# Patient Record
Sex: Female | Born: 1950 | Race: White | Hispanic: No | Marital: Married | Smoking: Never smoker
Health system: Southern US, Community
[De-identification: ages and names within clinical notes are randomized; demographics above are authoritative.]

## PROBLEM LIST (undated history)

## (undated) DIAGNOSIS — R7303 Prediabetes: Secondary | ICD-10-CM

## (undated) DIAGNOSIS — M8580 Other specified disorders of bone density and structure, unspecified site: Secondary | ICD-10-CM

## (undated) DIAGNOSIS — I10 Essential (primary) hypertension: Secondary | ICD-10-CM

## (undated) HISTORY — PX: PARTIAL HYSTERECTOMY: SHX80

## (undated) HISTORY — DX: Other specified disorders of bone density and structure, unspecified site: M85.80

## (undated) HISTORY — DX: Essential (primary) hypertension: I10

## (undated) HISTORY — DX: Prediabetes: R73.03

## (undated) HISTORY — PX: OTHER PROCEDURE: U1053

## (undated) HISTORY — PX: EGD/FLEX SIG: SHX3003

## (undated) MED ORDER — ESTRADIOL 0.1 MG/GM VA CREA
TOPICAL_CREAM | VAGINAL | Status: AC
Start: 2019-12-26 — End: ?

---

## 2015-12-29 ENCOUNTER — Encounter (INDEPENDENT_AMBULATORY_CARE_PROVIDER_SITE_OTHER): Payer: Self-pay | Admitting: Internal Medicine

## 2015-12-29 ENCOUNTER — Ambulatory Visit (INDEPENDENT_AMBULATORY_CARE_PROVIDER_SITE_OTHER): Payer: Medicare Other | Admitting: Internal Medicine

## 2015-12-29 VITALS — BP 131/84 | HR 64 | Resp 16 | Ht 68.0 in | Wt 175.0 lb

## 2015-12-29 DIAGNOSIS — E782 Mixed hyperlipidemia: Secondary | ICD-10-CM

## 2015-12-29 DIAGNOSIS — Z8249 Family history of ischemic heart disease and other diseases of the circulatory system: Secondary | ICD-10-CM

## 2015-12-29 NOTE — Progress Notes (Signed)
Cardiology Attending Note:    Subjective:  I reviewed the history.  Patient interviewed and examined.  History of present illness (HPI):  Heart Problem     Review of Systems (ROS): As per  the resident's note.  Past Medical, Family, Social History:  As per  the resident's  note.    Objective:   I have examined the patient and I concur with the resident's exam.    Assessment and plan reviewed with the resident physician.  I agree with the resident's plan as documented.    See the resident's note for further details.    Following instructions given to patient:  1)get echocardiogram    2) get fasting blood work done    3) follow up in 2 months      Savir Blanke R. Carlena Saxaub, MD, [facility]  Associate Professor of Medicine  [facility]  Division of Cardiovascular Medicine  [phone] [facility]([place] office)  [phone] [facility] office)  [phone] (fax)  [email or web address]

## 2015-12-29 NOTE — Patient Instructions (Signed)
1) get echocardiogram    2) get fasting blood work done    3) follow up in 2 months

## 2015-12-29 NOTE — Progress Notes (Signed)
CARDIOLOGY CLINIC NOTE    Attending MD: Dr. Carlena Saxaub    Chief Complaint:  Family h/o aortic stenosis (2 brothers)    History of Present Illness:     Morgan Shaw is a 65 year old female with family history of aortic stenosis, presenting for assessment.    Patient is here because she has 2 brothers diagnosed with aortic stenosis, one has already had valve replacement surgery, the other brother is scheduled for valve replacement next month. [place] in [place] and patient does not know any further details of their diagnoses.    Patient does snore, has sleep study scheduled for tomorrow night.    Patient denies CP, dizziness, lightheadedness.    FH:  Sleep apnea: 1 brother  Aortic stenosis: 2 brothers  Valve replacement: 1 brother completed valve replacement, other scheduled for next month.  Father: HF, deceased at age 65    SOCIAL HX:  Never smoker  EtOH 2-3 drinks per week    FUNCTIONAL STATUS:  - [place] this past weekend, no difficulty  - Yoga 2-3x per week  - Walks dogs 1 mile daily   - Walks 4.2 miles twice a week (beach walk with friends, is able to have conversation the whole time).    Past Medical and Surgical History:  History reviewed. No pertinent past medical history.    No past surgical history on file.    Allergies:  Allergies   Allergen Reactions   . Sulfa Drugs Rash       Medications:  No outpatient prescriptions have been marked as taking for the 12/29/15 encounter (Office Visit) with Shary Keyaub, Pam R., MD.       Social History:  Social History     Social History   . Marital status: Married     Spouse name: N/A   . Number of children: N/A   . Years of education: N/A     Occupational History   . Not on file.     Social History Main Topics   . Smoking status: Never Smoker   . Smokeless tobacco: Not on file   . Alcohol use Not on file   . Drug use: Not on file   . Sexual activity: Not on file     Other Topics Concern   . Not on file     Social History Narrative   . No  narrative on file       Family History:  Family History   Problem Relation Age of Onset   . Heart Attack Father 1969   . Heart Attack Paternal Grandfather 6163     Aortic heart valve replacement   . Other Brother 6163     Aortic heart valve replacement   . Other Brother 5967     Aortic heart valve replacement, sleep apnea     DIAGNOSTICS  EKG 12/29/15: NSR    Review of Systems:  Negative except as noted above    Physical Exam:  BP 131/84  Pulse 64  Resp 16  Ht 5\' 8"  (1.727 m)  Wt 79.4 kg (175 lb)  SpO2 97%  BMI 26.61 kg/m2 /      Gen: NAD  HEENT: NCAT  Neck: No JVP  Lungs: CTA B  CVS: Reg S1 and S2, no MRG  Ext: No CCE  Neuro: non-focal    Labs and Other Data:  12/19/15  Vitamin D 35.2  Cre 0.98    No  lipids on record    Assessment and Care Plan:  65 year old female with family history of aortic stenosis in 2 brothers, presenting for assessment. Patient asymptomatic, EKG NSR.    #Family h/o aortic stenosis:  - Check lipid panel, lipoprotein A  - Echo    Patient Instructions:  1)get echocardiogram    2) get fasting blood work done    3) follow up in 2 months    Patient seen and discussed with attending, Carlena Sax.  ____________________  Thomes Cake, MD   [facility], PGY-2  Pager [phone]

## 2015-12-30 LAB — ECG 12-LEAD
ATRIAL RATE: 61 {beats}/min
ECG INTERPRETATION: NORMAL
P AXIS: 58 degrees
PR INTERVAL: 164 ms
QRS INTERVAL/DURATION: 88 ms
QT: 392 ms
QTC INTERVAL: 394 ms
R AXIS: 11 degrees
T AXIS: 42 degrees
VENTRICULAR RATE: 61 {beats}/min

## 2016-01-02 LAB — LIPID(CHOL FRACT) PANEL, BLOOD
Cholesterol: 200 — ABNORMAL HIGH
HDL-Cholesterol: 71
LDL-Chol (Calc): 99
LDL-P: 1049 — ABNORMAL HIGH
LP-IR Score: <25
Triglycerides: 149

## 2016-01-05 ENCOUNTER — Encounter (INDEPENDENT_AMBULATORY_CARE_PROVIDER_SITE_OTHER): Payer: Self-pay | Admitting: Internal Medicine

## 2016-01-12 ENCOUNTER — Ambulatory Visit (INDEPENDENT_AMBULATORY_CARE_PROVIDER_SITE_OTHER): Payer: Medicare Other

## 2016-01-12 DIAGNOSIS — I519 Heart disease, unspecified: Secondary | ICD-10-CM

## 2016-01-12 DIAGNOSIS — Z8249 Family history of ischemic heart disease and other diseases of the circulatory system: Secondary | ICD-10-CM

## 2016-01-12 HISTORY — PX: OTHER PROCEDURE: U1053

## 2016-01-12 LAB — 2D ECHO WITH IMAGE ENHANCEMENT AGENT IF NECESSARY
IVC Diameter: 1.28 cm
LA Volume Index: 18.3 ml/m²
LV Ejection Fraction: 67 %
PA Pressure: 26.4 mmHg

## 2016-03-03 ENCOUNTER — Ambulatory Visit (INDEPENDENT_AMBULATORY_CARE_PROVIDER_SITE_OTHER): Payer: Medicare Other | Admitting: Internal Medicine

## 2016-03-03 ENCOUNTER — Encounter (INDEPENDENT_AMBULATORY_CARE_PROVIDER_SITE_OTHER): Payer: Self-pay | Admitting: Internal Medicine

## 2016-03-03 VITALS — BP 134/90 | HR 68 | Temp 98.2°F | Resp 16 | Ht 68.0 in | Wt 174.6 lb

## 2016-03-03 DIAGNOSIS — E669 Obesity, unspecified: Secondary | ICD-10-CM

## 2016-03-03 MED ORDER — PROBIOTIC ACIDOPHILUS PO
1.00 | Freq: Every day | ORAL | Status: DC
Start: ? — End: 2019-08-07

## 2016-03-03 MED ORDER — VITAMIN D (CHOLECALCIFEROL) 400 UNITS PO TABS: 400.00 [IU] | ORAL_TABLET | Freq: Every day | ORAL | Status: AC

## 2016-03-03 MED ORDER — OMEGA-3-ACID ETHYL ESTERS 1 GM OR CAPS
2.00 g | ORAL_CAPSULE | Freq: Two times a day (BID) | ORAL | Status: DC
Start: ? — End: 2021-05-04

## 2016-03-03 MED ORDER — MULTI-VITAMIN OR TABS: 1.00 | ORAL_TABLET | Freq: Every day | ORAL | Status: AC

## 2016-03-03 MED ORDER — CALCIUM 500 MG OR TABS: 500.00 mg | ORAL_TABLET | Freq: Three times a day (TID) | ORAL | Status: AC

## 2016-03-03 NOTE — Patient Instructions (Signed)
Follow up as needed

## 2016-03-03 NOTE — Progress Notes (Signed)
CARDIOLOGY CLINIC NOTE    Attending MD: Dr. Carlena Saxaub    Chief Complaint:  Family h/o aortic stenosis (2 brothers)    History of Present Illness:     Morgan Shaw is a 65 year old female with family history of aortic stenosis, presenting for assessment.      Interval History:  Her first visit with me was 12/29/15      TTE 5//17:  Summary:  1. The left ventricular size is normal and systolic function is normal.  2. Normal pattern of left ventricular diastolic filling.  3. No previous study for comparison.    Patient is here because she has 2 brothers diagnosed with aortic stenosis, one has already had valve replacement surgery, the other brother is scheduled for valve replacement next month. Brothers live in [place] and patient does not know any further details of their diagnoses.    Patient does snore, has sleep study scheduled for tomorrow night.    Patient denies CP, dizziness, lightheadedness.    FH:  Sleep apnea: 1 brother  Aortic stenosis: 2 brothers  Valve replacement: 1 brother completed valve replacement, other scheduled for next month.  Father: HF, deceased at age 65    SOCIAL HX:  Never smoker  EtOH 2-3 drinks per week    FUNCTIONAL STATUS:  - Went for a [place] hike in [place] with [place] this past weekend, no difficulty  - Yoga 2-3x per week  - Walks dogs 1 mile daily   - Walks 4.2 miles twice a week (beach walk with friends, is able to have conversation the whole time).    Past Medical and Surgical History:  No past medical history on file.    No past surgical history on file.    Allergies:  Allergies   Allergen Reactions   . Sulfa Drugs Rash       Medications:  No outpatient prescriptions have been marked as taking for the 03/03/16 encounter (Appointment) with Shary Keyaub, Zahari Fazzino R., MD.       Social History:  Social History     Social History   . Marital status: Married     Spouse name: N/A   . Number of children: N/A   . Years of education: N/A     Occupational History   . Not on file.     Social History  Main Topics   . Smoking status: Never Smoker   . Smokeless tobacco: Not on file   . Alcohol use Not on file   . Drug use: Not on file   . Sexual activity: Not on file     Other Topics Concern   . Not on file     Social History Narrative   . No narrative on file       Family History:  Family History   Problem Relation Age of Onset   . Heart Attack Father 1269   . Heart Attack Paternal Grandfather 5863     Aortic heart valve replacement   . Other Brother 8663     Aortic heart valve replacement   . Other Brother 7667     Aortic heart valve replacement, sleep apnea     DIAGNOSTICS  EKG 12/29/15: NSR    Review of Systems:  Negative except as noted above    Physical Exam:  There were no vitals taken for this visit. /      Gen: NAD  HEENT: NCAT  Neck: No JVP  Lungs: CTA B  CVS: Reg  S1 and S2, no MRG  Ext: No CCE  Neuro: non-focal    Labs and Other Data:  12/19/15  Vitamin D 35.2  Cre 0.98      12/30/15  LDL P 1049  LDL C 99  HDL 71  TG 149  TC 200  Lp (a) 12    Assessment and Care Plan:  65 year old female with family history of aortic stenosis in 2 brothers.    #Family h/o aortic stenosis:  Her TTE reveals no evidence of aortic valve pathology.  Her Lp(a) is normal and LDL P in good range.  She has good functional status and no symptoms to suggest ischemia.    Patient Instructions:  Follow up PRN    Klee Kolek R. Carlena Sax, MD, [facility]  Associate Professor of Medicine  [facility]  Division of Cardiovascular Medicine  [phone] [facility] office)  [phone] [facility] office)  [phone] (fax)  [email or web address]

## 2017-08-16 ENCOUNTER — Encounter (INDEPENDENT_AMBULATORY_CARE_PROVIDER_SITE_OTHER): Payer: Self-pay | Admitting: Internal Medicine

## 2017-08-16 ENCOUNTER — Ambulatory Visit (INDEPENDENT_AMBULATORY_CARE_PROVIDER_SITE_OTHER): Admitting: Internal Medicine

## 2017-08-16 VITALS — BP 151/106 | HR 76 | Ht 68.0 in | Wt 174.0 lb

## 2017-08-16 DIAGNOSIS — G4752 REM sleep behavior disorder: Principal | ICD-10-CM

## 2017-12-06 ENCOUNTER — Ambulatory Visit (INDEPENDENT_AMBULATORY_CARE_PROVIDER_SITE_OTHER): Admitting: Internal Medicine

## 2017-12-06 ENCOUNTER — Encounter (INDEPENDENT_AMBULATORY_CARE_PROVIDER_SITE_OTHER): Payer: Self-pay | Admitting: Internal Medicine

## 2017-12-06 VITALS — BP 143/91 | HR 76 | Ht 68.0 in | Wt 174.0 lb

## 2017-12-06 NOTE — Interdisciplinary (Signed)
I Morgan Shaw have reviewed medications and allergies with the patient.

## 2017-12-07 ENCOUNTER — Telehealth (INDEPENDENT_AMBULATORY_CARE_PROVIDER_SITE_OTHER): Payer: Self-pay | Admitting: Internal Medicine

## 2017-12-07 DIAGNOSIS — G479 Sleep disorder, unspecified: Secondary | ICD-10-CM

## 2017-12-07 DIAGNOSIS — G4752 REM sleep behavior disorder: Secondary | ICD-10-CM

## 2017-12-07 DIAGNOSIS — R0683 Snoring: Secondary | ICD-10-CM

## 2017-12-07 NOTE — Telephone Encounter (Signed)
The patient shortly after her appt yesterday and again this morning seeking results of her PSG. We received the results late afternoon yesterday.    - I have reviewed her PSG from 09/23/08 at [facility]: it is a good PSG, normal SOL and REM latency, normal sleep staging except minimal reduction in REM and increase in Stage 1 sleep. Sleep efficiency was normal to high at 89.3 %. AHI of 0.0 overall, in REM and supine desat to 90%. No movement disorder or arrhythmia.    I just called the pt with results of the sleep study. All qeuestions answered    Options include:  1. HST for definitive eval - if HST and PSG both normal than cn very confidently say she does not have OSA and jus thas primary RBD.  2. Med mgmt with trial of clonazepam first, then if fails, try cholinergic agent such as rivastigmine or donezepil    PLAN:  1) pt would like HST for more definitive eval for OSA, will order ASAP  2) if no OSA on HST, will proceed with med mgmt as above and rever to movement specialist to eval for early PD.

## 2019-02-13 ENCOUNTER — Encounter (INDEPENDENT_AMBULATORY_CARE_PROVIDER_SITE_OTHER): Payer: Self-pay

## 2019-02-16 ENCOUNTER — Other Ambulatory Visit (INDEPENDENT_AMBULATORY_CARE_PROVIDER_SITE_OTHER): Payer: Self-pay | Admitting: Family Medicine

## 2019-02-19 LAB — ALLERGEN PROFILE WITH TOTAL IGE, RESPIRATORY, AREA 13  -LABCORP
D001-IgE D pteronyssinus: <0.1 kU/L
D002-IgE D farinae: <0.1 kU/L
E001-IgE Cat Dander: <0.1 kU/L
E005-IgE Dog Dander: <0.1 kU/L
E072-IgE Mouse Urine: <0.1 kU/L
G002-IgE Bermuda Grass: <0.1 kU/L
G006-IgE Timothy Grass (Phleum pratense): <0.1 kU/L
G010-IgE Johnson Grass: <0.1 kU/L
I006-IgE Cockroach, German (Blatella germanica): <0.1 kU/L
Immunoglobulin E, Total: 11 IU/mL (ref 6–495)
M001-IgE Penicillium chrysogen: <0.1 kU/L
M002-IgE Cladosporium herbarum: <0.1 kU/L
M003-IgE Aspergillus fumigatus: <0.1 kU/L
M006-IgE Alternaria alternata: <0.1 kU/L
T002-IgE Alder, Grey (Alnus incana): <0.1 kU/L
T006-IgE Cedar, Mountain: <0.1 kU/L
T007-IgE Oak, White: <0.1 kU/L
T008-IgE Elm, American: <0.1 kU/L
T009-IgE Olive Tree: <0.1 kU/L
T010-IgE Walnut: <0.1 kU/L
T014-IgE Cottonwood (Populus deltoides): <0.1 kU/L
T070-IgE White Mulberry: <0.1 kU/L
W001-IgE Ragweed, Short: <0.1 kU/L
W006-IgE Mugwort (Artemisia vulgaris): <0.1 kU/L
W011-IgE Thistle, Russian: <0.1 kU/L
W014-IgE Pigweed, Rough: <0.1 kU/L

## 2019-02-19 LAB — CMP14 AND EGFR - LABCORP
A/G Ratio: 2.4 — ABNORMAL HIGH (ref 1.2–2.2)
ALT (SGPT): 20 IU/L (ref 0–32)
AST: 25 IU/L (ref 0–40)
Albumin: 4.5 g/dL (ref 3.8–4.8)
Alkaline Phos: 71 IU/L (ref 39–117)
BUN/Creatinine Ratio: 18 (ref 12–28)
BUN: 16 mg/dL (ref 8–27)
Bilirubin, Total: 0.4 mg/dL (ref 0.0–1.2)
Calcium: 9.6 mg/dL (ref 8.7–10.3)
Carbon Dioxide: 24 mmol/L (ref 20–29)
Chloride: 100 mmol/L (ref 96–106)
Creatinine: 0.9 mg/dL (ref 0.57–1.00)
Globulin, Total: 1.9 g/dL (ref 1.5–4.5)
Glucose: 75 mg/dL (ref 65–99)
Potassium: 3.9 mmol/L (ref 3.5–5.2)
Protein, Total, Serum: 6.4 g/dL (ref 6.0–8.5)
Sodium: 138 mmol/L (ref 134–144)
eGFR If Africn Am: 76 mL/min/{1.73_m2} (ref >59)
eGFR If NonAfricn Am: 66 mL/min/{1.73_m2} (ref >59)

## 2019-02-19 LAB — CBC WITH DIFF, BLOOD
Baso (Absolute): 0 10*3/uL (ref 0.0–0.2)
Basos: 1 %
Eos (Absolute): 0.1 10*3/uL (ref 0.0–0.4)
Eos: 1 %
Hematocrit: 37.6 % (ref 34.0–46.6)
Hemoglobin: 12.2 g/dL (ref 11.1–15.9)
Immature Grans (Abs): 0 10*3/uL (ref 0.0–0.1)
Immature Granulocytes: 0 %
Lymphs (Absolute): 2.2 10*3/uL (ref 0.7–3.1)
Lymphs: 34 %
MCH: 28.8 pg (ref 26.6–33.0)
MCHC: 32.4 g/dL (ref 31.5–35.7)
MCV: 89 fL (ref 79–97)
Monocytes(Absolute): 0.5 10*3/uL (ref 0.1–0.9)
Monocytes: 7 %
Neutrophils (Absolute): 3.6 10*3/uL (ref 1.4–7.0)
Neutrophils: 57 %
Platelets: 302 10*3/uL (ref 150–450)
RBC: 4.23 x10E6/uL (ref 3.77–5.28)
RDW: 14.6 % (ref 11.7–15.4)
WBC: 6.4 10*3/uL (ref 3.4–10.8)

## 2019-02-19 LAB — TSH REFLEX TO T4 - LABCORP: TSH: 0.595 u[IU]/mL (ref 0.450–4.500)

## 2019-02-19 LAB — GLYCOSYLATED HGB(A1C), BLOOD: Hemoglobin A1c: 5.7 % — ABNORMAL HIGH (ref 4.8–5.6)

## 2019-02-22 ENCOUNTER — Telehealth (INDEPENDENT_AMBULATORY_CARE_PROVIDER_SITE_OTHER): Payer: Self-pay | Admitting: Family Medicine

## 2019-03-20 ENCOUNTER — Telehealth (INDEPENDENT_AMBULATORY_CARE_PROVIDER_SITE_OTHER): Payer: Self-pay | Admitting: Family Medicine

## 2019-04-19 MED ORDER — HYDROCHLOROTHIAZIDE 25 MG OR TABS
ORAL_TABLET | ORAL | Status: DC
Start: 2019-02-03 — End: 2019-07-31

## 2019-04-19 NOTE — Progress Notes (Signed)
GENERAL PHYSICAL EXAM FEMALE    SUBJECTIVE  Morgan Shaw is a 68 year old female who comes to the office for a routine well-woman evaluation.  LMP: Menopause  PAP: normal & (-) HPV 03/2015  Mammo:03/16/19, normal  CRC screen: 12/2015 Colonoscopy- tubular adenoma x1, repeat in 2022  DEXA (if >= 12 y/o or has risk factors): 04/06/19, see results - Osteopenia  Eye Exam: Yearly, due soon  Dental:  Every 6 months, went to dentist today   Labs: 02/16/19, all normal other than some mild prediabetes, A1C 5.7  Diet: No restrictions  Exercise: Walk 3x/week, jazzercise   Immunizations: up-to-date, Shingrix due but wishes to hold until pandemic calms down a bit.  Concerns:  -Patient c/o swollen glands x 1 month, negative covid test x 1 month, worsens when temperature changes at night. Not severe today, they tend to change in size.  -Pt tried to wean her self off of her HCTZ, but her BP went back up with 140s/90s and was making her feel ill and off. She resumed the med and is feeling better now. BP controlled at home and in the office.    Depression Screen:   PHQ 2 - In the past 2 weeks:  Has the patient felt down, depressed or hopeless? No  Does patient have less interest/pleasure in doing things? No.     Safety Screen/Functional Ability:   Does the patient's home have safety issues? No  Does the patient's home LACK functioning smoke alarms? No  Does the patient's home have throw rugs, poor lighting, or slippery bathtub/shower? No  Does the patient's home LACK grab bars in bathrooms, handrails on stairs and steps? No  Does the patient have any hearingloss? Yes, no hearing aids yet  Does the patient need help withactivities of daily life? No  Does the patient live alone? No    Fall Risk  Up and Go Test? Pass     Review of systems:    HEENT: neg head trauma, neg vision or hearing issues, neg URI s/s  RESP: neg dyspnea, neg cough, neg chest wall pain  CV:neg CP, neg arrythmia, neg palpitations  GI:neg abd pain, neg nausea, neg  vomiting, neg bloating, neg diarrhea, neg constipation, neg rectal bleeding, neg ulcers, neg flatulence and belching  GU:neg vaginal discharge, neg dysparuenia, neg vaginal itching, neg abn vaginal bleeding, neg dysuria, neg hematuria, neg polyuria, neg change in urinary stream, neg nocturia, neg incomplete emptying  BREAST: neg masses, cysts, neg mastalgia, neg niple d/c or bleeding, neg skin changes  MSK: neg muscles, joints aches and pains B/L UE and LE  SKIN: neg lesions, neg ulcers, neg rashes, neg nail changes  PSYCH: neg depression, anxiety, panic attacks or stress    Allergies:    Allergies   Allergen Reactions    Sulfa Drugs Rash    Amoxicillin Unspecified       Immunization history: up to date    Past Medical History:   Diagnosis Date    Conductive hearing loss of both ears 04/20/2019    Dry eyes     Fibrocystic breast changes of both breasts     History of IBS     Osteopenia 04/20/2019    PCR negative for herpes simplex virus types 1 and 2 (HSV-1 and HSV-2) DNA     Prediabetes     Tubular adenoma of colon 04/20/2019    Vitamin D deficiency 04/20/2019       Past Surgical History:   Procedure  Laterality Date    Colonoscopy      - tubular adenoma x1- 2022    EGD/FLEX SIG      benign flex-sig    PARTIAL HYSTERECTOMY      P. Hysterectomy still has cervix and ovaries        Family History   Problem Relation Name Age of Onset    Heart Attack Father  32    Heart Attack Paternal Grandfather  39        Aortic heart valve replacement    Other Brother  9        Aortic heart valve replacement, obesity    Other Brother  67        Aortic heart valve replacement, sleep apnea, obesity        Social History     Socioeconomic History    Marital status: Married     Spouse name: Not on file    Number of children: Not on file    Years of education: Not on file    Highest education level: Not on file   Occupational History    Not on file   Social Needs    Financial resource strain: Not on file    Food  insecurity:     Worry: Not on file     Inability: Not on file    Transportation needs:     Medical: Not on file     Non-medical: Not on file   Tobacco Use    Smoking status: Never Smoker    Smokeless tobacco: Never Used   Substance and Sexual Activity    Alcohol use: Yes     Frequency: 2-3 times a week    Drug use: Not Currently    Sexual activity: Yes   Lifestyle    Physical activity:     Days per week: Not on file     Minutes per session: Not on file    Stress: Not on file   Relationships    Social connections:     Talks on phone: Not on file     Gets together: Not on file     Attends religious service: Not on file     Active member of club or organization: Not on file     Attends meetings of clubs or organizations: Not on file     Relationship status: Not on file    Intimate partner violence:     Fear of current or ex partner: Not on file     Emotionally abused: Not on file     Physically abused: Not on file     Forced sexual activity: Not on file   Other Topics Concern    Military Service Not Asked    Blood Transfusions Not Asked    Caffeine Concern Not Asked    Occupational Exposure Not Asked    Hobby Hazards Not Asked    Sleep Concern Not Asked    Stress Concern Not Asked    Weight Concern Not Asked    Special Diet Not Asked    Back Care Not Asked    Exercise Not Asked    Bike Helmet Not Asked    Seat Belt Not Asked    Self-Exams Not Asked   Social History Narrative    Not on file        Medications:    Current Outpatient Medications on File Prior to Visit   Medication Sig Dispense Refill    calcium  carbonate (OS-CAL) 500 MG tablet Take 500 mg by mouth 3 times daily.      cholecalciferol (VITAMIN D) 400 UNITS TABS tablet Take 400 Units by mouth daily.      hydrochlorothiazide (HYDRODIURIL) 25 MG tablet Take 1/2 daily      Lactobacillus (PROBIOTIC ACIDOPHILUS PO) Take 1 Dose by mouth daily.      multivitamin (MULTI-VITAMIN) tablet Take 1 tablet by mouth daily.      omega-3 acid  ethyl esters (LOVAZA) 1 GM capsule Take 2 g by mouth 2 times daily.       No current facility-administered medications on file prior to visit.          OBJECTIVE  Vitals:    04/20/19 1318   BP: 98/64   Pulse: 84   Temp: 97.9 F (36.6 C)   Weight: 77.8 kg (171 lb 9.6 oz)   Height: 5' 7.5" (1.715 m)     Body mass index is 26.48 kg/m.    Physical exam:  GEN: NAD, well nourished, well developed   HEENT: No thyromegaly, no lymphadenopathy  Normocephalic, PERRLA, EOMI, TM WNL B/L and neg perf, ext canals WNL B/L, ext nare WNL B/L, intranasal with NL pink mucosa, neg edema of turbinates and clear d/c noted, oropharynx WNL, tonsils WNL B/L  Cardiovascular: regular rate and rhythm without MGR, S1S2 noted  Respiratory: clear to auscultation bilaterally without w/r/r  Abdomen:Abdomen soft, non-tender. BS normal. No masses, organomegaly  LYMPH: Small b/l anterior cervical LAD, mildly TTP, neg supraclavicular or axillary node enlargement noted B/L  MSK: UE and LE WNL B/L; strength +5/5 B/L LE and UE  NEURO: CN 2-12 intact, moves all extremities appropriately, normal sensation to LT throughout, normal gait  PSYCH: AOx4, mood stable, affect full  SKIN:neg lesions or ulcerations noted, nails B/L UE and LE without onychomycosis or discoloration    ASSESSMENT/PLAN    Morgan Shaw is a 68 year old female with:    ICD-10-CM ICD-9-CM   1. Encounter for general adult medical examination with abnormal findings Z00.01 V70.0   2. Depression screen Z13.31 V79.0   3. Cervical lymphadenopathy R59.0 785.6   4. Essential hypertension I10 401.9   5. 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 Tubular adenoma of colon D12.6 211.3   7. Conductive hearing loss of both ears H90.0 389.06   8. Osteopenia of multiple sites M85.89 733.90   9. Vitamin D deficiency E55.9 268.9   10. Non-seasonal allergic rhinitis due to other allergic trigger J30.89 477.8   11. Vaginal atrophy N95.2 627.3   12. Atopic dermatitis,  unspecified type L20.9 691.8   13. Obstructive sleep apnea syndrome G47.33 327.23     1. Encounter for general adult medical examination with abnormal findings  Reviewed and discussed general wellness, preventive care, recent labs.  Encouraged to cont healthy diet and regular exercising  To hold off on Shingrix for another few months, or until current pandemic calms down a bit.    2. Depression screen  Negative    3. Cervical lymphadenopathy  Likely reactive, not bad today. Consider neck U/S in the future if it continues or becomes more persistent.    4. Essential hypertension  Controlled, cont HCTZ    5. Other specified disorders of carbohydrate metabolism (CMS-HCC)  Borderline, A1C 5.7. Encouraged to focus on low carb diet, portion control and regular exercising, including weight training 2-3 days a week.    6. Tubular adenoma of colon  Repeat colonoscopy in 2022    7. Conductive hearing loss of both ears  Stable    8. Osteopenia of multiple sites  No fractures, cont vit D and calcium. Weight resistance training few times a week recommended.    9. Vitamin D deficiency  On Vit D supplement    10. Non-seasonal allergic rhinitis due to other allergic trigger  Stable, recent allergy panel test unremarkable    11. Vaginal atrophy  Pt's previous estrogen inserts became too expensive, requests generic version. Pt did not like the gel/cream version before.  Sent Vagifem tabs Rx to pharmacy. Asked to call us back if this is too expensive also.    12. Atopic dermatitis, unspecified type  Stable    13. Obstructive sleep apnea syndrome  Stable on CPAP at night      Recommended testing:   Continue every 3-5 yr pap smears  Continue annual mammograms if indicated  Continue Healthy diet, regular exercise, and strength/resistance training    Return in about 6 months (around 10/21/2019), or if symptoms worsen or fail to improve.    Iline Oven. Alvino Chapel, DO, CAQSM  [facility]

## 2019-04-20 ENCOUNTER — Encounter (INDEPENDENT_AMBULATORY_CARE_PROVIDER_SITE_OTHER): Payer: Self-pay | Admitting: Family Practice

## 2019-04-20 ENCOUNTER — Ambulatory Visit (INDEPENDENT_AMBULATORY_CARE_PROVIDER_SITE_OTHER): Admitting: Family Practice

## 2019-04-20 VITALS — BP 98/64 | HR 84 | Temp 97.9°F | Ht 67.5 in | Wt 171.6 lb

## 2019-04-20 MED ORDER — ESTRADIOL 10 MCG VA TABS
10.0000 ug | ORAL_TABLET | VAGINAL | 2 refills | Status: DC
Start: 2019-04-23 — End: 2019-08-06

## 2019-04-24 ENCOUNTER — Encounter (INDEPENDENT_AMBULATORY_CARE_PROVIDER_SITE_OTHER): Admitting: Family Medicine

## 2019-07-18 ENCOUNTER — Encounter (INDEPENDENT_AMBULATORY_CARE_PROVIDER_SITE_OTHER): Payer: Self-pay

## 2019-07-30 ENCOUNTER — Telehealth (INDEPENDENT_AMBULATORY_CARE_PROVIDER_SITE_OTHER): Payer: Self-pay | Admitting: Family Medicine

## 2019-07-30 NOTE — Telephone Encounter (Signed)
Pt scheduled tomorrow. FYI CPD

## 2019-07-30 NOTE — Telephone Encounter (Signed)
Recommend MA float tomorrow (or today) and add on to my schedule tomorrow. Just not during an hour where I already have CPEs. And MA float for ekg should be before visit with me

## 2019-07-30 NOTE — Telephone Encounter (Signed)
JB, please schedule MA float EKG and appt with AK

## 2019-07-30 NOTE — Telephone Encounter (Signed)
Please advise 

## 2019-07-30 NOTE — Telephone Encounter (Signed)
Patient states on 11/12 while camping she had episode of HTN  117/100, she felt dizzy. BP did come down but continues to not feel well and concerned about family hx of heart disease. First available appt 11/24, took Same day appt.  FYI incase patient should be seen sooner

## 2019-07-31 ENCOUNTER — Ambulatory Visit (INDEPENDENT_AMBULATORY_CARE_PROVIDER_SITE_OTHER): Admitting: Family Medicine

## 2019-07-31 ENCOUNTER — Ambulatory Visit (INDEPENDENT_AMBULATORY_CARE_PROVIDER_SITE_OTHER)

## 2019-07-31 ENCOUNTER — Encounter (INDEPENDENT_AMBULATORY_CARE_PROVIDER_SITE_OTHER): Payer: Self-pay | Admitting: Family Medicine

## 2019-07-31 VITALS — BP 136/94 | HR 76 | Temp 97.9°F | Ht 67.5 in | Wt 171.5 lb

## 2019-07-31 MED ORDER — ESTRADIOL 0.1 MG/GM VA CREA
TOPICAL_CREAM | VAGINAL | Status: DC
Start: 2019-06-05 — End: 2019-12-27

## 2019-07-31 MED ORDER — VALSARTAN-HYDROCHLOROTHIAZIDE 160-12.5 MG OR TABS
1.0000 | ORAL_TABLET | Freq: Every day | ORAL | 3 refills | Status: DC
Start: 2019-07-31 — End: 2020-02-28

## 2019-07-31 NOTE — Progress Notes (Signed)
Morgan Shaw is a 68 year old female had concerns including Hypertension ([place] ).    SUBJECTIVE:    Was going to go camping and was feeling fatigued and generally unwell so decided to take her BP.   177/113 on 07/26/2019  164/112 when she repeated it.     Then went shopping and while walking in the store. Had to steady herself against the aisle. Felt dizzy like she was on a boat and felt she might fall. None since.     Morgan Shaw also notes feeling R sided chest ache. Aches when coughing and discomfort upon palpation. She started new exercise regimen with weights.  Chest feels sore to the touch and was worse with a cough.     Decided to double the BP medication to 25mg  HCTZ.     Last at-home BP reading was 143/10.     Falling asleep early each evening. Denies chest pain while doing jazzercise. Denies cough. No SOB.     FHx ASCVD  Dad MI 59 death  Brother x2 both have heart issues, stents  Younger brother HTN      Patient Active Problem List    Diagnosis Date Noted   . Vitamin D deficiency 04/20/2019   . Herpes simplex 04/20/2019   . Conductive hearing loss of both ears 04/20/2019   . Osteopenia 04/20/2019   . 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 Tubular adenoma of colon 04/20/2019   . Essential hypertension 04/20/2019   . Allergic rhinitis 04/20/2019   . Atopic dermatitis, unspecified type 04/20/2019   . REM behavioral disorder 08/16/2017   . Vaginal atrophy 04/19/2017     Last Assessment & Plan:   Patient with long history of vaginal irritation, discharge, dyspareunia and urinary symptoms.  Vaginitis panel sent ([facility]).  Given trial Imvexxy and written information.     . Sleep apnea 12/10/2014        Past Medical History:   Diagnosis Date   . Conductive hearing loss of both ears 04/20/2019   . Dry eyes    . Fibrocystic breast changes of both breasts    . History of IBS    . Osteopenia 04/20/2019   . PCR negative for herpes simplex virus types 1 and 2 (HSV-1 and HSV-2) DNA    .  Prediabetes    . Tubular adenoma of colon 04/20/2019   . Vitamin D deficiency 04/20/2019       Past Surgical History:   Procedure Laterality Date   . Colonoscopy      - tubular adenoma x1- 2022   . EGD/FLEX SIG      benign flex-sig   . PARTIAL HYSTERECTOMY      P. Hysterectomy still has cervix and ovaries       Current Outpatient Medications on File Prior to Visit   Medication Sig   . calcium carbonate (OS-CAL) 500 MG tablet Take 500 mg by mouth 3 times daily.   . cholecalciferol (VITAMIN D) 400 UNITS TABS tablet Take 400 Units by mouth daily.   Marland Kitchen estradiol (ESTRACE) 0.1 MG/GM vaginal cream AS DIRECTED 3 TIMES A WEEK INTRAVAGINALLY 30 DAY(S)   . estradiol (VAGIFEM) 10 MCG vaginal tabs Insert 1 tablet (10 mcg) vaginally twice a week.   . Lactobacillus (PROBIOTIC ACIDOPHILUS PO) Take 1 Dose by mouth daily.   . multivitamin (MULTI-VITAMIN) tablet Take 1 tablet by mouth daily.   Marland Kitchen omega-3 acid ethyl esters (LOVAZA) 1 GM capsule Take 2 g  by mouth 2 times daily.     No current facility-administered medications on file prior to visit.        Allergies as of 07/31/2019 - Verified 07/31/2019   Allergen Reaction Noted   . Sulfa drugs Rash 12/29/2015   . Amoxicillin Unspecified 02/17/2016         OBJECTIVE:  Vitals:    07/31/19 1058   BP: (!) 136/94   Pulse: 76   Temp: 97.9 F (36.6 C)   Weight: 77.8 kg (171 lb 8.3 oz)   Height: 5' 7.5" (1.715 m)     Body mass index is 26.47 kg/m.    Physical Exam  Constitutional:       Appearance: Normal appearance.   HENT:      Head: Normocephalic and atraumatic.   Eyes:      General: No scleral icterus.  Pulmonary:      Effort: Pulmonary effort is normal.   Skin:     Coloration: Skin is not jaundiced or pale.   Neurological:      Mental Status: She is alert.   Psychiatric:         Mood and Affect: Mood normal.         Behavior: Behavior normal.         Thought Content: Thought content normal.           Lab Results   Component Value Date    GLU 75 02/16/2019    NA 138 02/16/2019    K 3.9  02/16/2019    CL 100 02/16/2019    BICARB 24 02/16/2019    BUN 16 02/16/2019    CREAT 0.90 02/16/2019    GFRNON 66 02/16/2019    [place] 9.6 02/16/2019    TP 6.4 02/16/2019    ALB 4.5 02/16/2019    GLOB 1.9 02/16/2019    AGRATIO 2.4 (H) 02/16/2019    TBILI 0.4 02/16/2019    ALK 71 02/16/2019    AST 25 02/16/2019    ALT 20 02/16/2019     Lab Results   Component Value Date    WBC 6.4 02/16/2019    RBC 4.23 02/16/2019    HGB 12.2 02/16/2019    HCT 37.6 02/16/2019    PLT 302 02/16/2019    MCV 89 02/16/2019     Lab Results   Component Value Date    TSH 0.595 02/16/2019     Lab Results   Component Value Date    CHOL 200 (H) 12/30/2015    TRIG 149 12/30/2015    HDL 71 12/30/2015    LDLCALC 99 12/30/2015       Assessment & Plan     Morgan Shaw was seen today for hypertension.    Diagnoses and all orders for this visit:    Essential hypertension  -    increase valsartan-hydrochlorothiazide (DIOVAN HCT) 160-12.5 MG tablet; Take 1 tablet by mouth daily.  -     Cardiology Clinic    Hypertension, unspecified type  -     ($) ECG    FHx: ischemic heart disease  Referred to cardiology for CP.     Chest wall pain  -     X-Ray Chest, 2 views, PA and Lateral  ER precautions reviewed. rec cardiology w/u      Patient Instructions:  See Patient Education/Instructions section in Epic. Reviewed verbally and AVS available via MyChart for patient             Ala Bent, M.D.  [facility]  [address], [address]  [place], [place] [address]

## 2019-07-31 NOTE — Progress Notes (Signed)
($)   ECG      Date/Time: 07/31/2019 10:49 AM  Performed by: Jay Schlichter, MD  Authorized by: Jay Schlichter, MD   Interpreted by physician

## 2019-07-31 NOTE — Interdisciplinary (Signed)
See MD OV for documentation.

## 2019-07-31 NOTE — Addendum Note (Signed)
Addended by: Loura Pardon on: 07/31/2019 04:12 PM     Modules accepted: Kipp Brood

## 2019-07-31 NOTE — Patient Instructions (Signed)
-   The following medication changes were made today:   1. Stop HCTZ  -Start HCTZ/valsartan (sent to pharm)    - If you have not activated MyChart, please look for your email or text notification and activate your account so you may see your results, make appointments, communicate easily with me and my staff at [facility].       - Imaging tests have been ordered for you. Please complete at [facility]. (See below for more information)- chest xray. [address]      - I have made a referral for you for cardiology  [facility]  Drs. Dirk Dress, etc.  [address]  [place], [place] [address]  [phone]

## 2019-08-07 ENCOUNTER — Telehealth (INDEPENDENT_AMBULATORY_CARE_PROVIDER_SITE_OTHER): Payer: Self-pay | Admitting: Medical

## 2019-08-07 ENCOUNTER — Ambulatory Visit (INDEPENDENT_AMBULATORY_CARE_PROVIDER_SITE_OTHER): Admitting: Family Medicine

## 2019-08-07 MED ORDER — B-12 PO: ORAL | Status: AC

## 2019-08-07 NOTE — Telephone Encounter (Signed)
Patient states she         Please check with patient to inquire if she is still using the vagifem; we received a notice from the pharmacy that it had been cancelled   If needs RF, then OK to approved

## 2019-08-27 ENCOUNTER — Encounter (INDEPENDENT_AMBULATORY_CARE_PROVIDER_SITE_OTHER): Payer: Self-pay | Admitting: Hospital

## 2019-08-28 ENCOUNTER — Encounter (INDEPENDENT_AMBULATORY_CARE_PROVIDER_SITE_OTHER): Payer: Medicare Other | Admitting: Cardiovascular Disease

## 2019-08-30 ENCOUNTER — Telehealth (INDEPENDENT_AMBULATORY_CARE_PROVIDER_SITE_OTHER): Payer: Medicare Other | Admitting: Cardiovascular Disease

## 2019-10-08 ENCOUNTER — Other Ambulatory Visit: Payer: Self-pay

## 2019-10-08 ENCOUNTER — Encounter (INDEPENDENT_AMBULATORY_CARE_PROVIDER_SITE_OTHER): Payer: Self-pay | Admitting: Medical

## 2019-10-09 ENCOUNTER — Encounter (INDEPENDENT_AMBULATORY_CARE_PROVIDER_SITE_OTHER): Payer: Self-pay

## 2019-10-09 ENCOUNTER — Encounter (INDEPENDENT_AMBULATORY_CARE_PROVIDER_SITE_OTHER)

## 2019-10-15 ENCOUNTER — Telehealth (INDEPENDENT_AMBULATORY_CARE_PROVIDER_SITE_OTHER): Payer: Self-pay | Admitting: Family Medicine

## 2019-10-15 DIAGNOSIS — G4733 Obstructive sleep apnea (adult) (pediatric): Secondary | ICD-10-CM

## 2019-10-15 NOTE — Telephone Encounter (Signed)
Pt requesting referral for new CPAP mask, sys she uses "[facility]" right now but is open to any provider.

## 2019-10-15 NOTE — Telephone Encounter (Signed)
FYI pt left a message on the referral line as well. She may have used [facility] before under her straight Medicare and now she has [employer] HMO so she will need a referral through PCA and PCA advises what DME facility. Pt will also need a DME order. Thank you

## 2019-10-26 ENCOUNTER — Encounter (INDEPENDENT_AMBULATORY_CARE_PROVIDER_SITE_OTHER): Payer: Self-pay

## 2019-10-26 ENCOUNTER — Encounter (INDEPENDENT_AMBULATORY_CARE_PROVIDER_SITE_OTHER): Payer: Self-pay | Admitting: Hospital

## 2019-11-08 NOTE — Telephone Encounter (Signed)
 error

## 2019-12-11 ENCOUNTER — Encounter (INDEPENDENT_AMBULATORY_CARE_PROVIDER_SITE_OTHER): Payer: Self-pay

## 2019-12-26 ENCOUNTER — Other Ambulatory Visit (INDEPENDENT_AMBULATORY_CARE_PROVIDER_SITE_OTHER): Payer: Self-pay | Admitting: Physician Assistant

## 2019-12-26 DIAGNOSIS — N951 Menopausal and female climacteric states: Secondary | ICD-10-CM

## 2019-12-27 MED ORDER — ESTRADIOL 10 MCG VA TABS
10.0000 ug | ORAL_TABLET | VAGINAL | Status: DC
Start: ? — End: 2019-12-27

## 2019-12-27 MED ORDER — ESTRADIOL 10 MCG VA TABS
10.0000 ug | ORAL_TABLET | VAGINAL | 2 refills | Status: AC
Start: 2019-12-27 — End: ?

## 2020-01-01 ENCOUNTER — Encounter (INDEPENDENT_AMBULATORY_CARE_PROVIDER_SITE_OTHER): Payer: Self-pay

## 2020-02-04 ENCOUNTER — Telehealth (INDEPENDENT_AMBULATORY_CARE_PROVIDER_SITE_OTHER): Admitting: Family Medicine

## 2020-02-28 ENCOUNTER — Other Ambulatory Visit (INDEPENDENT_AMBULATORY_CARE_PROVIDER_SITE_OTHER): Payer: Self-pay | Admitting: Physician Assistant

## 2020-02-28 DIAGNOSIS — I10 Essential (primary) hypertension: Secondary | ICD-10-CM

## 2020-02-28 MED ORDER — VALSARTAN-HYDROCHLOROTHIAZIDE 160-12.5 MG OR TABS
1.0000 | ORAL_TABLET | Freq: Every day | ORAL | 2 refills | Status: DC
Start: 2020-02-28 — End: 2021-02-10

## 2020-10-30 ENCOUNTER — Encounter (INDEPENDENT_AMBULATORY_CARE_PROVIDER_SITE_OTHER): Payer: Self-pay

## 2020-11-26 ENCOUNTER — Telehealth (INDEPENDENT_AMBULATORY_CARE_PROVIDER_SITE_OTHER)

## 2021-01-01 ENCOUNTER — Encounter (INDEPENDENT_AMBULATORY_CARE_PROVIDER_SITE_OTHER): Payer: Self-pay | Admitting: Medical

## 2021-01-01 ENCOUNTER — Ambulatory Visit (INDEPENDENT_AMBULATORY_CARE_PROVIDER_SITE_OTHER): Admitting: Medical

## 2021-01-01 ENCOUNTER — Other Ambulatory Visit (INDEPENDENT_AMBULATORY_CARE_PROVIDER_SITE_OTHER): Payer: Self-pay

## 2021-01-01 VITALS — BP 118/78 | HR 72 | Temp 97.7°F | Ht 67.72 in | Wt 167.0 lb

## 2021-01-01 NOTE — Progress Notes (Signed)
 Morgan Shaw is a 70 year old female here for routine physical. [facility] CPE concerns: She has multiple concerns to address, listed below.    1. Skin concern on right arm. States she has itchy "dry patches" along right arm. States the spots "never heal" and have been existing for a while. 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  No personal or family hx skin [place]    2. Problem with severe chapped lips/lip dryness. Reports that there is also redness/crusting along lips. States she was told to use clotrimazole cream on lips during online doctor visit. States symptoms come back after pt stops using clotrimazole.     3. Inquiring about blood tests due to intermittent fatigue.    4. Right hip concern, feels that it "locks up"/is stiff at times. Patient reports there is pain when it becomes stiff.      GYN hx:   Menstrual hx: Menopause / hysterectomy   Last pap smear:    Abnormal paps: occasionally; then repeat normal  Sexually active: yes, monogomous.       Health Maintenance:  Vitamin D intake: daily, unsure amount   Calcium Intake: daily, unsure amount     Immunizations:   Last Tetanus booster : 06/27/17   Pneumococcal: UTD   Shingrix: Due, reminded pt to complete at pharmacy  Mammogram: Ordered today  Colonoscopy: 12/24/15  Labs: None  HCV screen completed: 12/19/15  DEXA: 04/06/19  Exercise: walking 3-4 x/ week 2.5 week jazzercise     [facility] PHQ9 DEPRESSION QUESTIONNAIRE 01/01/2021 01/01/2021   Interest 0 0   Depressed 0 0   Sleep -- --   Energy -- --   Appetite -- --   Failure -- --   Concentration -- --   Movement -- --   Suicide -- --   Summary(Manual) -- --   Summary(Calculated) -- --   Functional -- --   Some recent data might be hidden     Fall Risk Screen 01/01/2021 03/03/2016 12/29/2015   1 or more of the following as reported by patient/caregiver = patient risk for falls No fall risk identified No fall risk identified No fall risk identified   Interventions implemented during clinic visit Roomed near  nurse's station Oriented to room Oriented to room         No LMP recorded.    Gyn History     LMP:     Age at Menarche:     Age at First Pregnancy:     Age at Menopause:     Gyn History Comments:     Sexual Activity: Yes; No partner data on record    Contraception: No contraception data on record          Health Maintenance Topics with due status: Overdue       Topic Date Due    Shingles Vaccine Never done    Breast Cancer Screen 03/15/2020    COVID-19 Vaccine 04/06/2020    Colorectal Cancer Screening 12/23/2020     Health Maintenance Topics with due status: Not Due       Topic Last Completion Date    Tetanus 06/27/2017    Influenza 06/05/2019    PHQ2 depression screen 01/01/2021    CC Annual Fall Risk Screen 01/01/2021     Health Maintenance Topics with due status: Completed       Topic Last Completion Date    Hepatitis C Screening 12/19/2015    Pneumococcal Vaccine 01/26/2018  Osteoporosis Screening 04/06/2019       Current Outpatient Medications on File Prior to Visit   Medication Sig   . calcium carbonate (OS-CAL) 500 MG tablet Take 500 mg by mouth 3 times daily.   . cholecalciferol (VITAMIN D) 400 UNITS TABS tablet Take 400 Units by mouth daily.   . Cyanocobalamin (B-12 PO)    . estradiol (VAGIFEM) 10 MCG vaginal tabs Insert 1 tablet (10 mcg) vaginally twice a week.   . multivitamin (MULTI-VITAMIN) tablet Take 1 tablet by mouth daily.   Marland Kitchen omega-3 acid ethyl esters (LOVAZA) 1 GM capsule Take 2 g by mouth 2 times daily.   . valsartan-hydrochlorothiazide (DIOVAN HCT) 160-12.5 MG tablet Take 1 tablet by mouth daily.     No current facility-administered medications on file prior to visit.       Allergies as of 01/01/2021 - Verified 01/01/2021   Allergen Reaction Noted   . Amoxicillin Rash 02/17/2016   . Lisinopril Rash    . Sulfa drugs Rash 12/29/2015       Family History   Problem Relation Name Age of Onset   . Other Mother          Temporal Arteritis   . Heart Attack Father  50   . Heart Disease Father     .  Heart Disease Brother  60        Aortic heart valve replacement, obesity   . Heart Disease Brother  71        Aortic heart valve replacement, sleep apnea, obesity   . Hypertension Brother     . Heart Attack Paternal Grandfather  39        Aortic heart valve replacement     Family Status   Relation Status   . Mo Alive   . Fa Deceased   . Sis Alive   . Bro Alive   . Bro Alive   . Bro Alive   . PGFa Deceased       Social History     Occupational History   . Not on file   Tobacco Use   . Smoking status: Never Smoker   . Smokeless tobacco: Never Used   Substance and Sexual Activity   . Alcohol use: Yes     Alcohol/week: 2.0 standard drinks     Types: 2 Standard drinks or equivalent per week   . Drug use: Not Currently   . 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 Negative for fever and unexpected weight change.   HENT: Negative for hearing loss, nosebleeds, postnasal drip, sore throat, tinnitus and trouble swallowing.    Eyes: Negative for visual disturbance.        Dry eyes   Respiratory: Negative for cough, shortness of breath and wheezing.    Cardiovascular: Negative for chest pain, palpitations and leg swelling.   Gastrointestinal: Negative for abdominal pain, blood in stool, constipation, diarrhea and vomiting.   Endocrine: Negative for cold intolerance, heat intolerance and polydipsia.   Genitourinary: Negative for difficulty urinating, dysuria, frequency, hematuria, pelvic pain, vaginal bleeding and vaginal discharge.  Musculoskeletal: Negative for back pain.   Skin: Negative for rash.        Lip dryness  Changing/new mole   Neurological: Negative for dizziness, seizures, syncope, weakness, numbness and  headaches.   Hematological: Negative for adenopathy. Does not bruise/bleed easily.   Psychiatric/Behavioral: Negative for dysphoric mood and sleep disturbance. The patient is not nervous/anxious.      GU:neg vaginal discharge, neg dysparuenia, neg vaginal itching, neg abn vaginal bleeding, neg dysuria, neg hematuria, neg polyuria, neg change in urinary stream, neg nocturia, neg incomplete emptying  BREAST: neg masses, cysts, neg mastalgia, neg nipple d/c or bleeding, neg skin changes     Physical Exam:  Vitals:    01/01/21 1334   BP: 118/78   Pulse: 72   Temp: 97.7 F (36.5 C)   Weight: 75.8 kg (167 lb)   Height: 5' 7.72" (1.72 m)     Body mass index is 25.6 kg/m.      General Appearance: healthy, alert, no distress, pleasant affect, cooperative.  Eyes:  conjunctivae and corneas clear. PERRL, EOM's intact..  Ears:  normal TMs and canal.  Nose:  normal, patent, no lesions  Mouth: no erythema or exudate seen on pharynx  Neck:  Neck supple. No adenopathy, thyroid symmetric, normal size.  Heart:  normal rate and regular rhythm, no murmurs, clicks, or gallops.  Lungs: clear to auscultation and percussion, no chest deformities noted.  Breasts: breasts symmetric, no dominant or suspicious mass, no skin or nipple changes and no axillary or supraclavicular adenopathy.  Abdomen: BS normal.  Abdomen soft, non-tender.  No masses or organomegaly.  Extremities:  no cyanosis, clubbing, or edema.  Skin: warm, dry, and normal color. No rashes . R dorsal forearm lateral proximal and midline distal with 2 flesh colored, rough domed papule 3 mm. R lateral upper arm with excoriated / scabbed slight rough lesion approx 4mm  Pelvic/ Rectal: deferred no complaints.  Neuro: Gait normal. Reflexes normal and symmetric. Sensation and strength grossly normal.  Musculoskeletal: normal muscle strength and tone  Mental Status: normal affect, speech, and eye contact      ASSESSMENT/PLAN    H[place] was seen today for physical.    Diagnoses and all  orders for this visit:    1. Encounter for general adult medical examination with abnormal findings    2. Encounter for screening for malignant neoplasm of breast, unspecified screening modality  -     [facility]o Digital Screening Mammography - Bilateral    3. Screen for colon cancer  -     Gastroenterology Clinic    4. History of colon polyps  Comments:  TA  Orders:  -     Gastroenterology Clinic    5. Fatigue, unspecified type  -     CBC w/ Diff [place]  -     TSH and T4 - [facility]    6. Osteopenia, unspecified location  Comments:  Reviewed importance daily Vit D, calcium , and weight baring exercise  Orders:  -     DEXA Hip and Spine    7. Essential hypertension  Comments:  Controlled; continue current medication   Orders:  -     CMP  -     Lipid Panel Green Plasma Separator Tube  -     Random Urine Microalb/Creat Ratio Panel    8. Vitamin D deficiency  -     Vitamin D, 25-OH Total Yellow serum separator tube    9. History of COVID-19  Comments:  Reported. Husband + test at same time, pt did not test  Symptoms mild/ none residual  Overview:  12/21      10. Right hip pain  Comments:  Offered xray for further eval; pt declines today and will cont to monitor     11. Screening for depression  Comments:  PHQ0       12. Viral warts, unspecified type  Comments:  R dorsal forearm x 2     13. Neoplasm of uncertain behavior of skin of upper arm  Comments:  Recommend polysporin to area, if persists f/u for recheck     Indication: verbal consent to treat obtained  Method: freeze-thaw-freeze 10 sec with liquid nitrogen  Location:  R dorsal distal forearm R proximal lateral forearm   Number: 2  Wound Care: patient tolerated well, post-cryotherapy instructions reviewed verbally; patient reminded to expect hypopigmented scars from the procedure    Health maintenance:      - immunizations:Shingrix due; obtain from pharm[facility]    - Colon [place] screen: due, referral submitted   - DEXA: ordered    -Schedule Mammogram as ordered   - routine labs  per orders, will contact pt with results     Follow  healthful lifestyle measures including regular exercise, healthy eating,  and stress management.   Encouraged dietary calcium with supplementation (with calcium carbonate) only as needed to achieve 1200mg  daily.    Use Link below to estimate calcium equivalents in your diet.   [email or web address]://ods.https://www.garner.info/    Take Vitamin D3 1000 IU per day.          Retail buyer, PA-C    [facility]  [address], [address]  [place], [place] [address]    Signature Derived From Controlled Access Password, 01/01/2021, 5:00 PM

## 2021-01-01 NOTE — Patient Instructions (Addendum)
 Health maintenance:      - immunizations:Shingrix due; obtain from pharmay    - Colon [place] screen: due, referral submitted   - DEXA: ordered    -Schedule Mammogram as ordered   - routine labs per orders, will contact pt with results    Follow  healthful lifestyle measures including regular exercise, healthy eating,  and stress management.   Encouraged dietary calcium with supplementation (with calcium carbonate) only as needed to achieve 1200mg  daily.    Use Link below to estimate calcium equivalents in your diet.   [email or web address]://[email or web address]://www.garner.info/    Take Vitamin D3  at least 1000 IU per day.      1)  Imaging (DEXA, Mammogram) has  been ordered for you at [facility].     Please visit [email or web address]    For Appointments: [phone]  Please bring your ID, insurance card, and credit card.      2) Labs: go to [email or web address] or call [phone] to schedule an appointment.   You may walk in without appt but may experience longer wait time.   You must fast for 10-12 hours . I encourage you to stay hydrated with water during this time with at least a glass of water before you appointment. You may also have black coffee or tea ok, nothing else.

## 2021-01-02 ENCOUNTER — Encounter (INDEPENDENT_AMBULATORY_CARE_PROVIDER_SITE_OTHER): Payer: Self-pay | Admitting: Medical

## 2021-01-02 DIAGNOSIS — M25551 Pain in right hip: Secondary | ICD-10-CM

## 2021-01-02 NOTE — Telephone Encounter (Signed)
 From: Darnelle Going  To: [employer], [place]  Sent: 01/02/2021 12:11 PM PDT  Subject: Referral to check Hip    When we spoke yesterday at my well woman visit, you had suggested an ex-ray of my right hip which I indicated wasn't necessary. However, I have changed my mind as my hip is hurting again and I would like a referral to get an exray completed afterall.  Thanks  [employer]

## 2021-01-02 NOTE — Telephone Encounter (Signed)
 AM, please advise.

## 2021-01-03 ENCOUNTER — Encounter (INDEPENDENT_AMBULATORY_CARE_PROVIDER_SITE_OTHER): Payer: Self-pay | Admitting: Medical

## 2021-01-14 ENCOUNTER — Encounter (INDEPENDENT_AMBULATORY_CARE_PROVIDER_SITE_OTHER): Payer: Self-pay

## 2021-01-16 ENCOUNTER — Encounter (INDEPENDENT_AMBULATORY_CARE_PROVIDER_SITE_OTHER): Payer: Self-pay | Admitting: Hospital

## 2021-01-21 LAB — COMPREHENSIVE METABOLIC PANEL, BLOOD
A/G Ratio: 1.8 (ref 1.2–2.2)
ALT (SGPT): 17 IU/L (ref 0–32)
AST: 19 IU/L (ref 0–40)
Albumin: 4.4 g/dL (ref 3.8–4.8)
Alkaline Phos: 80 IU/L (ref 44–121)
BUN/Creatinine Ratio: 14 (ref 12–28)
BUN: 15 mg/dL (ref 8–27)
Bilirubin, Total: 0.3 mg/dL (ref 0.0–1.2)
Calcium: 10 mg/dL (ref 8.7–10.3)
Carbon Dioxide: 23 mmol/L (ref 20–29)
Chloride: 99 mmol/L (ref 96–106)
Creatinine: 1.05 mg/dL — ABNORMAL HIGH (ref 0.57–1.00)
EGFR: 57 mL/min/{1.73_m2} — ABNORMAL LOW (ref >59)
Globulin, Total: 2.5 g/dL (ref 1.5–4.5)
Glucose: 102 mg/dL — ABNORMAL HIGH (ref 65–99)
Potassium: 4.4 mmol/L (ref 3.5–5.2)
Protein, Total, Serum: 6.9 g/dL (ref 6.0–8.5)
Sodium: 137 mmol/L (ref 134–144)

## 2021-01-21 LAB — LIPID(CHOL FRACT) PANEL, BLOOD
Cholesterol: 237 mg/dL — ABNORMAL HIGH (ref 100–199)
HDL Cholesterol: 85 mg/dL (ref >39)
LDL Chol CAL (NIH) - LABCORP: 130 mg/dL — ABNORMAL HIGH (ref 0–99)
Non-HDL Cholesterol: 152 mg/dL — ABNORMAL HIGH (ref 0–129)
Triglycerides: 126 mg/dL (ref 0–149)
VLDL Cholesterol CAL: 22 mg/dL (ref 5–40)

## 2021-01-21 LAB — CBC WITH DIFF, BLOOD
Baso (Absolute): 0.1 10*3/uL (ref 0.0–0.2)
Basos: 1 %
Eos (Absolute): 0.1 10*3/uL (ref 0.0–0.4)
Eos: 1 %
Hematocrit: 40.3 % (ref 34.0–46.6)
Hemoglobin: 13.3 g/dL (ref 11.1–15.9)
Immature Grans (Abs): 0 10*3/uL (ref 0.0–0.1)
Immature Granulocytes: 0 %
Lymphs (Absolute): 2.1 10*3/uL (ref 0.7–3.1)
Lymphs: 31 %
MCH: 29.7 pg (ref 26.6–33.0)
MCHC: 33 g/dL (ref 31.5–35.7)
MCV: 90 fL (ref 79–97)
Monocytes(Absolute): 0.5 10*3/uL (ref 0.1–0.9)
Monocytes: 7 %
Neutrophils (Absolute): 4.1 10*3/uL (ref 1.4–7.0)
Neutrophils: 60 %
Platelets: 313 10*3/uL (ref 150–450)
RBC: 4.48 x10E6/uL (ref 3.77–5.28)
RDW: 13 % (ref 11.7–15.4)
WBC: 6.9 10*3/uL (ref 3.4–10.8)

## 2021-01-21 LAB — TSH AND T4 - LABCORP
TSH: 0.57 u[IU]/mL (ref 0.450–4.500)
Thyroxine (T4): 8.5 ug/dL (ref 4.5–12.0)

## 2021-01-21 LAB — VITAMIN D, 25-OH TOTAL: Vitamin D, 25-Hydroxy: 35.7 ng/mL (ref 30.0–100.0)

## 2021-01-21 LAB — RANDOM URINE MICROALB/CREAT RATIO PANEL
Creatinine, Urine: 38.9 mg/dL
Microalb/Creat Ratio: <8 mg/g creat (ref 0–29)
Microalbumin, Urine: <3 ug/mL

## 2021-02-07 ENCOUNTER — Other Ambulatory Visit (INDEPENDENT_AMBULATORY_CARE_PROVIDER_SITE_OTHER): Payer: Self-pay | Admitting: Physician Assistant

## 2021-02-07 DIAGNOSIS — I10 Essential (primary) hypertension: Secondary | ICD-10-CM

## 2021-02-10 MED ORDER — VALSARTAN-HYDROCHLOROTHIAZIDE 160-12.5 MG OR TABS
ORAL_TABLET | ORAL | 4 refills | Status: DC
Start: 2021-02-10 — End: 2021-08-26

## 2021-02-10 NOTE — Telephone Encounter (Signed)
 Seen 01/01/2021 for CPE, labs 01/2021  The last visit note in Epic was reviewed and the medication refilled

## 2021-03-13 ENCOUNTER — Other Ambulatory Visit: Payer: Self-pay

## 2021-03-19 ENCOUNTER — Other Ambulatory Visit (INDEPENDENT_AMBULATORY_CARE_PROVIDER_SITE_OTHER): Payer: Self-pay

## 2021-03-19 LAB — STOOL IMMUNOCHEMICAL OCCULT BLOOD: Occult Blood, Fecal, IA: NEGATIVE

## 2021-04-13 HISTORY — PX: OTHER PROCEDURE: U1053

## 2021-04-14 ENCOUNTER — Encounter (INDEPENDENT_AMBULATORY_CARE_PROVIDER_SITE_OTHER): Payer: Self-pay

## 2021-04-28 ENCOUNTER — Telehealth (INDEPENDENT_AMBULATORY_CARE_PROVIDER_SITE_OTHER): Payer: Self-pay | Admitting: Physician Assistant

## 2021-04-28 ENCOUNTER — Other Ambulatory Visit (INDEPENDENT_AMBULATORY_CARE_PROVIDER_SITE_OTHER): Payer: Self-pay

## 2021-05-01 ENCOUNTER — Other Ambulatory Visit (INDEPENDENT_AMBULATORY_CARE_PROVIDER_SITE_OTHER): Payer: Self-pay

## 2021-05-04 ENCOUNTER — Ambulatory Visit (INDEPENDENT_AMBULATORY_CARE_PROVIDER_SITE_OTHER): Admitting: Physician Assistant

## 2021-05-04 ENCOUNTER — Encounter (INDEPENDENT_AMBULATORY_CARE_PROVIDER_SITE_OTHER)

## 2021-05-04 ENCOUNTER — Encounter (INDEPENDENT_AMBULATORY_CARE_PROVIDER_SITE_OTHER): Payer: Self-pay | Admitting: Physician Assistant

## 2021-05-04 ENCOUNTER — Encounter (INDEPENDENT_AMBULATORY_CARE_PROVIDER_SITE_OTHER): Payer: Self-pay

## 2021-05-04 VITALS — BP 110/70 | HR 67 | Temp 97.2°F | Ht 67.72 in | Wt 167.0 lb

## 2021-05-07 ENCOUNTER — Encounter (INDEPENDENT_AMBULATORY_CARE_PROVIDER_SITE_OTHER): Payer: Self-pay

## 2021-05-08 ENCOUNTER — Encounter (INDEPENDENT_AMBULATORY_CARE_PROVIDER_SITE_OTHER): Payer: Self-pay | Admitting: Hospital

## 2021-05-14 ENCOUNTER — Telehealth (INDEPENDENT_AMBULATORY_CARE_PROVIDER_SITE_OTHER): Payer: Self-pay | Admitting: Family Medicine

## 2021-05-15 ENCOUNTER — Encounter (INDEPENDENT_AMBULATORY_CARE_PROVIDER_SITE_OTHER): Payer: Self-pay | Admitting: Family Medicine

## 2021-05-20 ENCOUNTER — Telehealth (INDEPENDENT_AMBULATORY_CARE_PROVIDER_SITE_OTHER): Payer: Self-pay | Admitting: Medical

## 2021-05-20 DIAGNOSIS — Z13220 Encounter for screening for lipoid disorders: Secondary | ICD-10-CM

## 2021-05-28 ENCOUNTER — Encounter (INDEPENDENT_AMBULATORY_CARE_PROVIDER_SITE_OTHER): Payer: Self-pay

## 2021-07-03 LAB — CBC WITH DIFF, BLOOD
Baso (Absolute): 0 10*3/uL (ref 0.0–0.2)
Basos: 1 %
Eos (Absolute): 0 10*3/uL (ref 0.0–0.4)
Eos: 1 %
Hematocrit: 36.2 % (ref 34.0–46.6)
Hemoglobin: 12.2 g/dL (ref 11.1–15.9)
Immature Grans (Abs): 0 10*3/uL (ref 0.0–0.1)
Immature Granulocytes: 0 %
Lymphs (Absolute): 1.7 10*3/uL (ref 0.7–3.1)
Lymphs: 29 %
MCH: 29.4 pg (ref 26.6–33.0)
MCHC: 33.7 g/dL (ref 31.5–35.7)
MCV: 87 fL (ref 79–97)
Monocytes(Absolute): 0.4 10*3/uL (ref 0.1–0.9)
Monocytes: 8 %
Neutrophils (Absolute): 3.6 10*3/uL (ref 1.4–7.0)
Neutrophils: 61 %
Platelets: 315 10*3/uL (ref 150–450)
RBC: 4.15 x10E6/uL (ref 3.77–5.28)
RDW: 13 % (ref 11.7–15.4)
WBC: 5.8 10*3/uL (ref 3.4–10.8)

## 2021-07-03 LAB — COMPREHENSIVE METABOLIC PANEL, BLOOD
A/G Ratio: 1.9 (ref 1.2–2.2)
ALT (SGPT): 13 IU/L (ref 0–32)
AST: 19 IU/L (ref 0–40)
Albumin: 4.3 g/dL (ref 3.8–4.8)
Alkaline Phos: 80 IU/L (ref 44–121)
BUN/Creatinine Ratio: 15 (ref 12–28)
BUN: 16 mg/dL (ref 8–27)
Bilirubin, Total: 0.3 mg/dL (ref 0.0–1.2)
Calcium: 9.6 mg/dL (ref 8.7–10.3)
Carbon Dioxide: 23 mmol/L (ref 20–29)
Chloride: 96 mmol/L (ref 96–106)
Creatinine: 1.05 mg/dL — ABNORMAL HIGH (ref 0.57–1.00)
EGFR: 57 mL/min/{1.73_m2} — ABNORMAL LOW (ref >59)
Globulin, Total: 2.3 g/dL (ref 1.5–4.5)
Glucose: 99 mg/dL (ref 70–99)
Potassium: 4 mmol/L (ref 3.5–5.2)
Protein, Total, Serum: 6.6 g/dL (ref 6.0–8.5)
Sodium: 134 mmol/L (ref 134–144)

## 2021-07-03 LAB — TSH AND T4 - LABCORP
TSH: 0.574 u[IU]/mL (ref 0.450–4.500)
Thyroxine (T4): 9.2 ug/dL (ref 4.5–12.0)

## 2021-08-24 ENCOUNTER — Other Ambulatory Visit: Payer: Self-pay

## 2021-08-25 ENCOUNTER — Ambulatory Visit (INDEPENDENT_AMBULATORY_CARE_PROVIDER_SITE_OTHER): Admitting: Physician Assistant

## 2021-08-26 ENCOUNTER — Encounter (INDEPENDENT_AMBULATORY_CARE_PROVIDER_SITE_OTHER): Payer: Self-pay | Admitting: Physician Assistant

## 2021-08-26 ENCOUNTER — Ambulatory Visit (INDEPENDENT_AMBULATORY_CARE_PROVIDER_SITE_OTHER): Admitting: Physician Assistant

## 2021-08-26 VITALS — BP 102/74 | HR 71 | Temp 97.8°F | Ht 67.72 in | Wt 164.8 lb

## 2021-08-26 MED ORDER — HYDROCHLOROTHIAZIDE 12.5 MG OR TABS
12.5000 mg | ORAL_TABLET | Freq: Every day | ORAL | 3 refills | Status: DC
Start: 2021-08-26 — End: 2021-09-18

## 2021-08-27 ENCOUNTER — Telehealth (INDEPENDENT_AMBULATORY_CARE_PROVIDER_SITE_OTHER): Payer: Self-pay | Admitting: Physician Assistant

## 2021-08-27 DIAGNOSIS — R911 Solitary pulmonary nodule: Secondary | ICD-10-CM

## 2021-08-31 ENCOUNTER — Encounter (INDEPENDENT_AMBULATORY_CARE_PROVIDER_SITE_OTHER): Payer: Self-pay

## 2021-08-31 ENCOUNTER — Encounter (INDEPENDENT_AMBULATORY_CARE_PROVIDER_SITE_OTHER): Payer: Self-pay | Admitting: Hospital

## 2021-09-09 ENCOUNTER — Encounter (INDEPENDENT_AMBULATORY_CARE_PROVIDER_SITE_OTHER): Payer: Self-pay | Admitting: Physician Assistant

## 2021-09-09 DIAGNOSIS — K838 Other specified diseases of biliary tract: Secondary | ICD-10-CM

## 2021-09-16 ENCOUNTER — Telehealth (INDEPENDENT_AMBULATORY_CARE_PROVIDER_SITE_OTHER): Payer: Self-pay | Admitting: Family Medicine

## 2021-09-18 ENCOUNTER — Other Ambulatory Visit (INDEPENDENT_AMBULATORY_CARE_PROVIDER_SITE_OTHER): Payer: Self-pay | Admitting: Physician Assistant

## 2021-09-18 DIAGNOSIS — I10 Essential (primary) hypertension: Secondary | ICD-10-CM

## 2021-09-18 MED ORDER — HYDROCHLOROTHIAZIDE 12.5 MG OR TABS
ORAL_TABLET | ORAL | 1 refills | Status: AC
Start: 2021-09-18 — End: ?

## 2021-09-30 ENCOUNTER — Telehealth (INDEPENDENT_AMBULATORY_CARE_PROVIDER_SITE_OTHER): Payer: Self-pay | Admitting: Family Medicine

## 2021-11-20 DIAGNOSIS — G4733 Obstructive sleep apnea (adult) (pediatric): Secondary | ICD-10-CM | POA: Diagnosis not present

## 2021-11-20 DIAGNOSIS — I10 Essential (primary) hypertension: Secondary | ICD-10-CM | POA: Diagnosis not present

## 2021-11-20 DIAGNOSIS — Z86010 Personal history of colonic polyps: Secondary | ICD-10-CM | POA: Diagnosis not present

## 2021-11-20 DIAGNOSIS — M8580 Other specified disorders of bone density and structure, unspecified site: Secondary | ICD-10-CM | POA: Diagnosis not present

## 2021-11-24 ENCOUNTER — Other Ambulatory Visit: Payer: Self-pay | Admitting: Internal Medicine

## 2021-11-24 DIAGNOSIS — Z1231 Encounter for screening mammogram for malignant neoplasm of breast: Secondary | ICD-10-CM

## 2022-01-11 ENCOUNTER — Ambulatory Visit
Admission: RE | Admit: 2022-01-11 | Discharge: 2022-01-11 | Disposition: A | Discharge status: Home / Self Care | Payer: Medicare Other | Source: Ambulatory Visit | Attending: Internal Medicine | Admitting: Internal Medicine

## 2022-01-11 DIAGNOSIS — Z1231 Encounter for screening mammogram for malignant neoplasm of breast: Secondary | ICD-10-CM

## 2022-01-21 DIAGNOSIS — H524 Presbyopia: Secondary | ICD-10-CM | POA: Diagnosis not present

## 2022-01-25 DIAGNOSIS — M9901 Segmental and somatic dysfunction of cervical region: Secondary | ICD-10-CM | POA: Diagnosis not present

## 2022-01-25 DIAGNOSIS — M9905 Segmental and somatic dysfunction of pelvic region: Secondary | ICD-10-CM | POA: Diagnosis not present

## 2022-01-25 DIAGNOSIS — M9903 Segmental and somatic dysfunction of lumbar region: Secondary | ICD-10-CM | POA: Diagnosis not present

## 2022-01-25 DIAGNOSIS — M9904 Segmental and somatic dysfunction of sacral region: Secondary | ICD-10-CM | POA: Diagnosis not present

## 2022-01-28 ENCOUNTER — Encounter: Payer: Self-pay | Admitting: *Deleted

## 2022-02-01 ENCOUNTER — Ambulatory Visit: Payer: Medicare Other | Admitting: Neurology

## 2022-02-01 ENCOUNTER — Encounter: Payer: Self-pay | Admitting: Neurology

## 2022-02-01 ENCOUNTER — Telehealth: Payer: Self-pay | Admitting: *Deleted

## 2022-02-01 VITALS — BP 134/88 | HR 69 | Ht 68.0 in | Wt 168.4 lb

## 2022-02-01 DIAGNOSIS — M9904 Segmental and somatic dysfunction of sacral region: Secondary | ICD-10-CM | POA: Diagnosis not present

## 2022-02-01 DIAGNOSIS — G4733 Obstructive sleep apnea (adult) (pediatric): Secondary | ICD-10-CM

## 2022-02-01 DIAGNOSIS — Z789 Other specified health status: Secondary | ICD-10-CM | POA: Diagnosis not present

## 2022-02-01 DIAGNOSIS — M9905 Segmental and somatic dysfunction of pelvic region: Secondary | ICD-10-CM | POA: Diagnosis not present

## 2022-02-01 DIAGNOSIS — M9903 Segmental and somatic dysfunction of lumbar region: Secondary | ICD-10-CM | POA: Diagnosis not present

## 2022-02-01 DIAGNOSIS — Z9989 Dependence on other enabling machines and devices: Secondary | ICD-10-CM

## 2022-02-01 DIAGNOSIS — M9901 Segmental and somatic dysfunction of cervical region: Secondary | ICD-10-CM | POA: Diagnosis not present

## 2022-02-01 NOTE — Patient Instructions (Addendum)
It was nice to meet you today.  As discussed, I will write for new supplies, you may benefit from a different style of nasal mask such as a nasal cradle or nasal cushion interface.  We will send the order to a local DME company and they should be in touch with you within the next week or so.  Please let us know if you do not hear from anybody within the next week or 2.  We will follow-up in sleep clinic in about 3 to 4 months, please try to be consistent and get back on track with your AutoPap therapy.  If your insurance mandates an updated sleep study, we will proceed with a home sleep test.

## 2022-02-01 NOTE — Progress Notes (Signed)
Subjective:    Patient ID: Dawn Baker is a 71 y.o. female.  HPI    Star Age, MD, PhD [facility] [address], [address]O. [place], [place] [address]  Dear Dr. Jacalyn Lefevre,   I saw your patient, Dawn Baker, upon your kind request, in my Sleep clinic today for initial consultation of her sleep disorder, in particular, evaluation of her prior diagnosis of obstructive sleep apnea.  The patient is unaccompanied today.  As you know, Dawn Baker is a 71 year old right-handed woman with an underlying medical history of hypertension, osteopenia, prediabetes and borderline overweight state, who was previously diagnosed with obstructive sleep apnea and placed on AutoPap therapy.  I reviewed your office note from 11/20/2021.  Prior sleep study results are not available for my review today.  She reports having had several sleep studies.  In the beginning, she had at least one home sleep test which was difficult for her and then she had a laboratory sleep study and it showed mild sleep apnea from what she recalls.  She then had a sleep study about 4 years ago, also in the sleep lab, she was still residing in [place], [place] at the time and was diagnosed with sleep apnea and was advised to start AutoPap therapy.  She reports a strong family history of sleep apnea affecting her 3 brothers and probably also her father but he was not formally tested.  She had trouble with nasal pillows as they cause soreness in her nostrils, she has tried a nasal mask.  She has not received any recent supplies and needs to establish with a local DME company.  She does not have any reports of her sleep studies with her or at home as she recalls but we will try to find reports.  We will also call your office for additional records if possible.  I was able to review her AutoPap compliance data for the past 1 year.  She has sporadically used her machine in the past year, last usage in October  and November 2022.  Pressure ranges 5 to 12 cm, 95th percentile of pressure at 7.3 cm, leak on the higher side with the 95th percentile at 18.5 L/min in the month of October and November.  Total usage of 9 days out of 90 days between late August through late November 2022.  Average AHI was 1.2/h.  She moved from [place] to be closer to family.  Her 2 sons live in [place] and she has 2 grandchildren in [place].  Her Epworth sleepiness score is 8/24, fatigue severity score is 34 out of 63.  She lives with her husband.  She is a non-smoker.  She drinks alcohol on the weekends, 1 or 2 drinks on average.  She drinks caffeine in the form of coffee, 2 large cups per day on average.  She denies night to night nocturia or recurrent morning headaches.  When she used her AutoPap more consistently, she felt she benefited from and that her sleep was more restful and she had less daytime tiredness.  She is motivated to get back on her AutoPap machine.  She would like to avoid a new sleep study if possible.  Her Family History Is Significant For: Family History  Problem Relation Age of Onset   Osteoarthritis Mother    Hypertension Father    Sleep apnea Father    Heart disease Father    Heart attack Father    Healthy Sister  Sleep apnea Brother    Stroke Brother    Sleep apnea Brother    Valvular heart disease Brother    Congestive Heart Failure Brother    Sleep apnea Brother    Healthy Son    Healthy Son    Breast cancer Neg Hx    Colon cancer Neg Hx    Lung cancer Neg Hx    Diabetes Neg Hx     Her Social History Is Significant For: Social History   Socioeconomic History   Marital status: Married    Spouse name: Not on file   Number of children: Not on file   Years of education: Not on file   Highest education level: Not on file  Occupational History   Not on file  Tobacco Use   Smoking status: Never   Smokeless tobacco: Never  Vaping Use   Vaping Use: Never used  Substance and  Sexual Activity   Alcohol use: Yes    Comment: socially   Drug use: Never   Sexual activity: Not on file  Other Topics Concern   Not on file  Social History Narrative   Caffeine: 2 cups.  Education [employer] grad.  Retired.     Social Determinants of Health   Financial Resource Strain: Not on file  Food Insecurity: Not on file  Transportation Needs: Not on file  Physical Activity: Not on file  Stress: Not on file  Social Connections: Not on file    Her Allergies Are:  Allergies  Allergen Reactions   Amoxicillin Hives   Sulfa Antibiotics Hives  :   Her Current Medications Are:  Outpatient Encounter Medications as of 02/01/2022  Medication Sig   CALCIUM-VITAMIN D PO Take 1 tablet by mouth daily.   diphenhydrAMINE (SIMPLY SLEEP) 25 MG tablet 50 mg.   diphenhydramine-acetaminophen (TYLENOL PM) 25-500 MG TABS tablet Take 1 tablet by mouth at bedtime as needed.   hydrochlorothiazide (HYDRODIURIL) 12.5 MG tablet Take 12.5 mg by mouth daily.   Multiple Vitamin (MULTI VITAMIN) TABS 1 tablet Orally Once a day   VITAMIN D PO Take 1 tablet by mouth daily.   No facility-administered encounter medications on file as of 02/01/2022.  :   Review of Systems:  Out of a complete 14 point review of systems, all are reviewed and negative with the exception of these symptoms as listed below:   Review of Systems  Neurological:        Moved from [place], [place], has OSA/ using CPAP. (Brought machine).   ESS 8, FSS 34.  Had several SS 5 yrs ago, brought and gave copy to pcp.    Objective:  Neurological Exam  Physical Exam Physical Examination:   Vitals:   02/01/22 0906  BP: 134/88  Pulse: 69    General Examination: The patient is a very pleasant 71 y.o. female in no acute distress. She appears well-developed and well-nourished and well groomed.   HEENT: Normocephalic, atraumatic, pupils are equal, round and reactive to light, extraocular tracking is good without limitation to  gaze excursion or nystagmus noted. Hearing is grossly intact. Face is symmetric with normal facial animation. Speech is clear with no dysarthria noted. There is no hypophonia. There is no lip, neck/head, jaw or voice tremor. Neck is supple with full range of passive and active motion. There are no carotid bruits on auscultation. Oropharynx exam reveals: mild mouth dryness, adequate dental hygiene, mild airway crowding, tongue protrudes centrally and palate elevates symmetrically, Mallampati class II.  Neck circumference 14 3/8 inches.   Chest: Clear to auscultation without wheezing, rhonchi or crackles noted.  Heart: S1+S2+0, regular and normal without murmurs, rubs or gallops noted.   Abdomen: Soft, non-tender and non-distended.  Extremities: There is no obvious edema.    Skin: Warm and dry without trophic changes noted.   Musculoskeletal: exam reveals no obvious joint deformities.   Neurologically:  Mental status: The patient is awake, alert and oriented in all 4 spheres. Her immediate and remote memory, attention, language skills and fund of knowledge are appropriate. There is no evidence of aphasia, agnosia, apraxia or anomia. Speech is clear with normal prosody and enunciation. Thought process is linear. Mood is normal and affect is normal.  Cranial nerves II - XII are as described above under HEENT exam.  Motor exam: Normal bulk, strength and tone is noted. There is no obvious tremor. Fine motor skills and coordination: grossly intact.  Cerebellar testing: No dysmetria or intention tremor. There is no truncal or gait ataxia.  Sensory exam: intact to light touch in the upper and lower extremities.  Gait, station and balance: She stands easily. No veering to one side is noted. No leaning to one side is noted. Posture is age-appropriate and stance is narrow based. Gait shows normal stride length and normal pace. No problems turning are noted.   Assessment and Plan:  In summary, Dawn Baker is a very pleasant 71 y.o.-year old female with an underlying medical history of hypertension, osteopenia, prediabetes and borderline overweight state, who presents for evaluation of her obstructive sleep apnea.  She was diagnosed several years ago and has an Health visitor, she estimates that her machine is about 71 years old.  She has not been consistent with using it for the past 6 months or so.  She would like to get back on track with it.  She has benefited from AutoPap therapy.  Prior sleep test results are not available but we will try to get records from your office and she will also look for records at home.  We may have to repeat a sleep study, we would probably ask for a home sleep test at the time.  I will write for new supplies and a new interface, she would like to try the nasal cushion that I described to her.  She had some trouble with the nasal pillows due to soreness.  She is advised to continue with treatment consistently and we will plan a follow-up in sleep clinic in about 3 to 4 months.  We talked about the importance of treating sleep apnea.  She is advised to call us with any interim questions or concerns.  I answered all her questions today and she was in agreement with our plan.  Thank you very much for allowing me to participate in the care of this nice patient. If I can be of any further assistance to you please do not hesitate to call me at [phone].  Sincerely,   Star Age, MD, PhD

## 2022-02-01 NOTE — Telephone Encounter (Signed)
Settle, Foye Spurling, RN Request made.      Previous Messages   ----- Message -----  From: Guy Begin, RN  Sent: 02/01/2022   9:35 AM EDT  To: Silvio Pate  Subject: sleep study report                             Good Morning,   Dr. Frances Furbish is asking for the sleep study report if pcp Dr. Dorinda Hill  has copy to send to [place] for this pt who she is seeing for sleep.     Dawn Baker  Female, 71 y.o., [birth year]  MRN:  [id number]

## 2022-02-02 ENCOUNTER — Telehealth: Payer: Self-pay | Admitting: *Deleted

## 2022-02-02 DIAGNOSIS — Z789 Other specified health status: Secondary | ICD-10-CM

## 2022-02-02 DIAGNOSIS — G4733 Obstructive sleep apnea (adult) (pediatric): Secondary | ICD-10-CM

## 2022-02-02 NOTE — Telephone Encounter (Signed)
Pt has called back to report that [facility] is accepted

## 2022-02-02 NOTE — Telephone Encounter (Signed)
Great. Orders, office note, insurance info all faxed to [place]. Received a receipt of confirmation.

## 2022-02-02 NOTE — Telephone Encounter (Signed)
Addendum made to office visit note.  Thank you, nothing further needed.

## 2022-02-02 NOTE — Telephone Encounter (Addendum)
Called pt to see who she wants for DME establishment. She doesn't have a preference except for who takes her insurance. She prefers to call her insurance to see if [facility] is accepted and then call us back.

## 2022-02-02 NOTE — Telephone Encounter (Signed)
Received sleep study report for pt from Dr. Cyndia Diver in [place].  [phone], fax [phone].  To Dr. Johny Sax inbox.

## 2022-02-03 NOTE — Telephone Encounter (Signed)
Noted. I also faxed the sleep study results over to [facility] so they could process the order for supplies and get her established as a patient there.

## 2022-02-03 NOTE — Telephone Encounter (Signed)
Received sleep study and it has been faxed to [facility]. Received a receipt of confirmation.

## 2022-02-04 NOTE — Addendum Note (Signed)
Addended by: Huston Foley on: 02/04/2022 04:33 PM   Modules accepted: Orders

## 2022-02-04 NOTE — Telephone Encounter (Signed)
Received fax from [facility] and they stated that pt would need a new Diagnostic Sleep Study.

## 2022-02-04 NOTE — Telephone Encounter (Signed)
Spoke with patient and discussed that [facility] let us know she would need a new sleep study completed. Pt verbalized understanding. HST has been ordered and pt is aware our sleep team will obtain auth then call her to schedule. If she hasn't been called within 2 weeks, call our office for update. Her questions were answered. She was appreciative.

## 2022-02-04 NOTE — Telephone Encounter (Signed)
HST ordered.

## 2022-02-18 NOTE — Telephone Encounter (Signed)
HST- [employer] medic[employer] no auth req ref # [id number] on 02/18/22. Patient is scheduled to pick up on 02/22/22 at 10:30 AM.

## 2022-02-22 ENCOUNTER — Ambulatory Visit: Payer: Medicare Other | Admitting: Neurology

## 2022-02-22 DIAGNOSIS — Z789 Other specified health status: Secondary | ICD-10-CM

## 2022-02-22 DIAGNOSIS — G4733 Obstructive sleep apnea (adult) (pediatric): Secondary | ICD-10-CM

## 2022-02-22 DIAGNOSIS — Z9989 Dependence on other enabling machines and devices: Secondary | ICD-10-CM

## 2022-02-24 NOTE — Progress Notes (Signed)
See procedure note.

## 2022-02-25 NOTE — Procedures (Signed)
   [facility]  HOME SLEEP TEST (Watch PAT) REPORT  STUDY DATE: 02/22/2022  DOB: 08-Oct-1950  MRN: [id number]  ORDERING CLINICIAN: Huston Foley, MD, PhD   REFERRING CLINICIAN: Melida Quitter, MD   CLINICAL INFORMATION/HISTORY: 71 year old right-handed woman with an underlying medical history of hypertension, osteopenia, prediabetes and borderline overweight state, who was previously diagnosed with obstructive sleep apnea and placed on AutoPap therapy.  She has not used her PAP machine in the recent past.  Epworth sleepiness score: 8/24.  BMI: 25.4 kg/m  FINDINGS:   Sleep Summary:   Total Recording Time (hours, min): 8 hours, 37 minutes  Total Sleep Time (hours, min):  7 hours, 10 minutes   Percent REM (%):    26.9%   Respiratory Indices:   Calculated pAHI (per hour):  16.2/hour         REM pAHI:    31.4/hour       NREM pAHI: 10.5/hour  Oxygen Saturation Statistics:    Oxygen Saturation (%) Mean: 92%   Minimum oxygen saturation (%):                 85%   O2 Saturation Range (%): 85-97%    O2 Saturation (minutes) <=88%: 0.2 min  Pulse Rate Statistics:   Pulse Mean (bpm):    60/min    Pulse Range (47-89/min)   IMPRESSION: OSA (obstructive sleep apnea)   RECOMMENDATION:  This home sleep test demonstrates moderate obstructive sleep apnea with a total AHI of 16.2/hour and O2 nadir of 85%.  Intermittent mild to moderate snoring was detected range.  Treatment with positive airway pressure is recommended. The patient will be advised to proceed with an autoPAP titration/trial at home for now. A full night titration study may be considered to optimize treatment settings, if needed down the road.  Alternative treatment options may include dental treatment with an oral appliance in appropriate candidates or surgical treatment with inspire, hypoglossal nerve stimulator in appropriate candidates.  Please note that untreated obstructive sleep apnea may carry  additional perioperative morbidity. Patients with significant obstructive sleep apnea should receive perioperative PAP therapy and the surgeons and particularly the anesthesiologist should be informed of the diagnosis and the severity of the sleep disordered breathing. The patient should be cautioned not to drive, work at heights, or operate dangerous or heavy equipment when tired or sleepy. Review and reiteration of good sleep hygiene measures should be pursued with any patient. Other causes of the patient's symptoms, including circadian rhythm disturbances, an underlying mood disorder, medication effect and/or an underlying medical problem cannot be ruled out based on this test. Clinical correlation is recommended. The patient and her referring provider will be notified of the test results. The patient will be seen in follow up in sleep clinic at [facility].  I certify that I have reviewed the raw data recording prior to the issuance of this report in accordance with the standards of the American Academy of Sleep Medicine (AASM).  INTERPRETING PHYSICIAN:   Huston Foley, MD, PhD  Board Certified in Neurology and Sleep Medicine  [facility] [address] [place], [place] [address] [phone]

## 2022-02-25 NOTE — Addendum Note (Signed)
Addended by: Huston Foley on: 02/25/2022 06:01 PM   Modules accepted: Orders

## 2022-03-02 ENCOUNTER — Telehealth: Payer: Self-pay

## 2022-03-02 NOTE — Telephone Encounter (Signed)
I called pt. I advised pt that Dr. Frances Furbish reviewed their sleep study results and found that pt has moderate osa. Dr. Frances Furbish recommends that pt start using her auto pap. Patient has an auto pap at home that is about 71 years old, per her report. I reviewed PAP compliance expectations with the pt. Pt is agreeable to starting an auto-PAP. I advised pt that an order will be sent to a DME, [facility], and [facility] will call the pt within about one week after they file with the pt's insurance. [facility] will show the pt how to use the machine, fit for masks, and troubleshoot the auto-PAP if needed. A follow up appt was made for insurance purposes with Dr. Frances Furbish on 05/10/22 at 9:45am. Pt verbalized understanding to arrive 15 minutes early and bring their auto-PAP. A letter with all of this information in it will be mailed to the pt as a reminder. I verified with the pt that the address we have on file is correct. Pt verbalized understanding of results. Pt had no questions at this time but was encouraged to call back if questions arise. I have sent the order to [facility] and have received confirmation that they have received the order.

## 2022-03-02 NOTE — Telephone Encounter (Signed)
-----   Message from Huston Foley, MD sent at 02/25/2022  6:01 PM EDT ----- Patient referred by PCP for reevaluation of her OSA.  She has not used her AutoPap in several months.  I saw her on 02/01/2022 and she had a home sleep test on 02/22/2022.    Please call and notify the patient that the recent home sleep test showed obstructive sleep apnea in the moderate range. I recommend treatment in the form of autoPAP, which means, that we don't have to bring her in for a sleep study with CPAP, but will let her start using a so called autoPAP machine at home, which is a CPAP-like machine with self-adjusting pressures. We will send the order to a local DME company (of her choice, or as per insurance requirement). The DME representative will fit her with a mask, educate her on how to use the machine, how to put the mask on, etc. I have placed an order in the chart. Please send the order, talk to patient, send report to referring MD. We will need a FU in sleep clinic for 10 weeks post-PAP set up, please arrange that with me or one of our NPs. Also reinforce the need for compliance with treatment. Thanks,   Huston Foley, MD, PhD [facility])

## 2022-03-22 NOTE — Telephone Encounter (Signed)
Pt is calling and requesting that her sleep study report be sent to [facility]. Pt stated this report need to be sent so she can receive her CPAP machine. Please fax to [facility] at [phone]

## 2022-03-22 NOTE — Telephone Encounter (Signed)
Sleep study report has been faxed to [facility] @ [phone]. Received a receipt of confirmation.

## 2022-03-29 DIAGNOSIS — S79912A Unspecified injury of left hip, initial encounter: Secondary | ICD-10-CM | POA: Diagnosis not present

## 2022-03-29 DIAGNOSIS — L570 Actinic keratosis: Secondary | ICD-10-CM | POA: Diagnosis not present

## 2022-03-29 DIAGNOSIS — W19XXXA Unspecified fall, initial encounter: Secondary | ICD-10-CM | POA: Diagnosis not present

## 2022-03-29 DIAGNOSIS — M25552 Pain in left hip: Secondary | ICD-10-CM | POA: Diagnosis not present

## 2022-03-31 DIAGNOSIS — G4733 Obstructive sleep apnea (adult) (pediatric): Secondary | ICD-10-CM | POA: Diagnosis not present

## 2022-04-16 ENCOUNTER — Other Ambulatory Visit: Payer: Self-pay | Admitting: Internal Medicine

## 2022-04-16 DIAGNOSIS — R7303 Prediabetes: Secondary | ICD-10-CM | POA: Diagnosis not present

## 2022-04-16 DIAGNOSIS — I10 Essential (primary) hypertension: Secondary | ICD-10-CM | POA: Diagnosis not present

## 2022-04-16 DIAGNOSIS — Z0000 Encounter for general adult medical examination without abnormal findings: Secondary | ICD-10-CM | POA: Diagnosis not present

## 2022-04-16 DIAGNOSIS — I2510 Atherosclerotic heart disease of native coronary artery without angina pectoris: Secondary | ICD-10-CM | POA: Diagnosis not present

## 2022-04-16 DIAGNOSIS — M8580 Other specified disorders of bone density and structure, unspecified site: Secondary | ICD-10-CM | POA: Diagnosis not present

## 2022-04-19 ENCOUNTER — Other Ambulatory Visit: Payer: Self-pay | Admitting: Internal Medicine

## 2022-04-19 DIAGNOSIS — M8588 Other specified disorders of bone density and structure, other site: Secondary | ICD-10-CM

## 2022-04-21 DIAGNOSIS — M25552 Pain in left hip: Secondary | ICD-10-CM | POA: Diagnosis not present

## 2022-04-28 DIAGNOSIS — M25552 Pain in left hip: Secondary | ICD-10-CM | POA: Diagnosis not present

## 2022-05-05 DIAGNOSIS — M25552 Pain in left hip: Secondary | ICD-10-CM | POA: Diagnosis not present

## 2022-05-10 ENCOUNTER — Ambulatory Visit: Payer: Medicare Other | Admitting: Neurology

## 2022-05-10 ENCOUNTER — Encounter: Payer: Self-pay | Admitting: Neurology

## 2022-05-10 VITALS — BP 120/77 | HR 66 | Ht 68.0 in | Wt 168.0 lb

## 2022-05-10 DIAGNOSIS — Z9989 Dependence on other enabling machines and devices: Secondary | ICD-10-CM

## 2022-05-10 DIAGNOSIS — G4733 Obstructive sleep apnea (adult) (pediatric): Secondary | ICD-10-CM | POA: Diagnosis not present

## 2022-05-10 NOTE — Patient Instructions (Addendum)
It was nice to see you again today.  As discussed, your recent home sleep test indicated moderate obstructive sleep apnea.  Please continue using your autoPAP regularly. While your insurance requires that you use PAP at least 4 hours each night on 70% of the nights, I recommend, that you not skip any nights and use it throughout the night if you can. Getting used to PAP and staying with the treatment long term does take time and patience and discipline. Untreated obstructive sleep apnea when it is moderate to severe can have an adverse impact on cardiovascular health and raise the risk for heart disease, arrhythmias, hypertension, congestive heart failure, stroke and diabetes. Untreated obstructive sleep apnea causes sleep disruption, nonrestorative sleep, and sleep deprivation. This can have an impact on your day to day functioning and cause daytime sleepiness and impairment of cognitive function, memory loss, mood disturbance, and problems focussing. Using PAP regularly can improve these symptoms.

## 2022-05-10 NOTE — Progress Notes (Signed)
Subjective:    Patient ID: Dawn Baker is a 71 y.o. female.  HPI    Interim history:   71 year old right-handed woman with an underlying medical history of hypertension, osteopenia, prediabetes and borderline overweight state, who presents for follow-up consultation of her obstructive sleep apnea after interim testing and starting AutoPap therapy.  The patient is unaccompanied today.  I first met her at the request of her primary care physician on 02/01/2022, at which time she reported a prior diagnosis of obstructive sleep apnea.  She had difficulty tolerating her AutoPap at the time and had not used it recently.  She was advised to proceed with a sleep study.  She had a home sleep test on 02/22/2022 which showed moderate obstructive sleep apnea with an AHI of 16.2/h, O2 nadir 85% with intermittent mild to moderate snoring detected.  She was advised to proceed with AutoPap therapy.  She did not qualify for a new machine.  She did restart treatment on 03/31/2022.    Today, 05/10/2022: I reviewed her AutoPap compliance data from 04/06/2022 through 05/05/2022, which is a total of 30 days, during which time she used her machine 23 days with percent use days greater than 4 hours at 33%, indicating suboptimal compliance with an average usage for days on treatment of 3 hours and 44 minutes, residual AHI at goal at 0.9/h, average pressure for the 95th percentile at 7.7 cm with a range of 5 to 12 cm with EPR of 3.  Leak acceptable with the 95th percentile at 15.4 L/min.  She reports trying to be consistent with her usage, she has a tendency to pull the mask off in the middle of the night.  She uses nasal pillows.  Her Epworth sleepiness score is 6 out of 24.  She was recently started on generic Crestor.  Her hydrochlorothiazide was increased as well.  The patient's allergies, current medications, family history, past medical history, past social history, past surgical history and problem list were reviewed and  updated as appropriate.   Previously:   02/01/22: (She) was previously diagnosed with obstructive sleep apnea and placed on AutoPap therapy.  I reviewed your office note from 11/20/2021.  Prior sleep study results are not available for my review today.  She reports having had several sleep studies.  In the beginning, she had at least one home sleep test which was difficult for her and then she had a laboratory sleep study and it showed mild sleep apnea from what she recalls.  She then had a sleep study about 4 years ago, also in the sleep lab, she was still residing in [place], [place] at the time and was diagnosed with sleep apnea and was advised to start AutoPap therapy.  She reports a strong family history of sleep apnea affecting her 3 brothers and probably also her father but he was not formally tested.  She had trouble with nasal pillows as they cause soreness in her nostrils, she has tried a nasal mask.  She has not received any recent supplies and needs to establish with a local DME company.  She does not have any reports of her sleep studies with her or at home as she recalls but we will try to find reports.  We will also call your office for additional records if possible.  I was able to review her AutoPap compliance data for the past 1 year.  She has sporadically used her machine in the past year, last usage in October and  November 2022.  Pressure ranges 5 to 12 cm, 95th percentile of pressure at 7.3 cm, leak on the higher side with the 95th percentile at 18.5 L/min in the month of October and November.  Total usage of 9 days out of 90 days between late August through late November 2022.  Average AHI was 1.2/h.  She moved from [place] to be closer to family.  Her 2 sons live in [place] and she has 2 grandchildren in [place].  Her Epworth sleepiness score is 8/24, fatigue severity score is 34 out of 63.  She lives with her husband.  She is a non-smoker.  She drinks alcohol on the weekends,  1 or 2 drinks on average.  She drinks caffeine in the form of coffee, 2 large cups per day on average.  She denies night to night nocturia or recurrent morning headaches.  When she used her AutoPap more consistently, she felt she benefited from and that her sleep was more restful and she had less daytime tiredness.  She is motivated to get back on her AutoPap machine.  She would like to avoid a new sleep study if possible.   Addendum, 02/02/2022: I received records from [facility], [place], [place].  Patient had a CPAP titration study on 08/22/2018.  Sleep efficiency was 73.3%, sleep latency 11.7 minutes.  She had lack of REM sleep.  CPAP was titrated from 5 cm to 8 cm.  Her AHI remained 0/h.  She was advised to start CPAP therapy at 8 cm.   She had a home sleep test on 01/03/2018 which indicated an AHI of 6/h, average oxygen saturation 96%, nadir was 80%.  Test equipment was apnea link.   She had a baseline sleep study on 09/20/2017.  Sleep efficiency was 89.3%, sleep latency 4.5 minutes, REM latency 79 minutes.  AHI was 0/h.  Average oxygen saturation was 94%, nadir was 90%.    Her Past Medical History Is Significant For: Past Medical History:  Diagnosis Date   High blood pressure    Osteopenia    Prediabetes     Her Past Surgical History Is Significant For: Past Surgical History:  Procedure Laterality Date   PARTIAL HYSTERECTOMY     ovaries remain 64 y of age    Her Family History Is Significant For: Family History  Problem Relation Age of Onset   Osteoarthritis Mother    Hypertension Father    Sleep apnea Father    Heart disease Father    Heart attack Father    Healthy Sister    Sleep apnea Brother    Stroke Brother    Sleep apnea Brother    Valvular heart disease Brother    Congestive Heart Failure Brother    Sleep apnea Brother    Healthy Son    Healthy Son    Breast cancer Neg Hx    Colon cancer Neg Hx    Lung cancer Neg Hx    Diabetes Neg  Hx     Her Social History Is Significant For: Social History   Socioeconomic History   Marital status: Married    Spouse name: Not on file   Number of children: Not on file   Years of education: Not on file   Highest education level: Not on file  Occupational History   Not on file  Tobacco Use   Smoking status: Never   Smokeless tobacco: Never  Vaping Use   Vaping Use: Never used  Substance and Sexual  Activity   Alcohol use: Yes    Comment: socially   Drug use: Never   Sexual activity: Not on file  Other Topics Concern   Not on file  Social History Narrative   Caffeine: 2 cups.  Education [employer] grad.  Retired.     Right handed   Social Determinants of Health   Financial Resource Strain: Not on file  Food Insecurity: Not on file  Transportation Needs: Not on file  Physical Activity: Not on file  Stress: Not on file  Social Connections: Not on file    Her Allergies Are:  Allergies  Allergen Reactions   Amoxicillin Hives   Sulfa Antibiotics Hives  :   Her Current Medications Are:  Outpatient Encounter Medications as of 05/10/2022  Medication Sig   CALCIUM-VITAMIN D PO Take 1 tablet by mouth daily.   diphenhydrAMINE (SIMPLY SLEEP) 25 MG tablet 50 mg.   hydrochlorothiazide (HYDRODIURIL) 25 MG tablet Take 25 mg by mouth every morning.   Multiple Vitamin (MULTI VITAMIN) TABS 1 tablet Orally Once a day   rosuvastatin (CRESTOR) 10 MG tablet Take 10 mg by mouth daily.   VITAMIN D PO Take 1 tablet by mouth daily.   [DISCONTINUED] diphenhydramine-acetaminophen (TYLENOL PM) 25-500 MG TABS tablet Take 1 tablet by mouth at bedtime as needed. (Patient not taking: Reported on 05/10/2022)   [DISCONTINUED] hydrochlorothiazide (HYDRODIURIL) 12.5 MG tablet Take 12.5 mg by mouth daily.   No facility-administered encounter medications on file as of 05/10/2022.  :  Review of Systems:  Out of a complete 14 point review of systems, all are reviewed and negative with the exception  of these symptoms as listed below:   Review of Systems  Neurological:        Patient is here for initial CPAP follow-up. She states she will wake up in the morning with the mask off. She doesn't have a hard time falling asleep however. ESS 6.    Objective:  Neurological Exam  Physical Exam Physical Examination:   Vitals:   05/10/22 0928  BP: 120/77  Pulse: 66    General Examination: The patient is a very pleasant 71 y.o. female in no acute distress. She appears well-developed and well-nourished and well groomed.   HEENT: Normocephalic, atraumatic, pupils are equal, round and reactive to light, extraocular tracking is good without limitation to gaze excursion or nystagmus noted. Hearing is grossly intact. Face is symmetric with normal facial animation. Speech is clear with no dysarthria noted. There is no hypophonia. There is no lip, neck/head, jaw or voice tremor. Neck is supple with full range of passive and active motion. There are no carotid bruits on auscultation. Oropharynx exam reveals: mild mouth dryness, adequate dental hygiene, mild airway crowding, tongue protrudes centrally and palate elevates symmetrically, Mallampati class II.  Neck circumference 14 3/8 inches.    Chest: Clear to auscultation without wheezing, rhonchi or crackles noted.   Heart: S1+S2+0, regular and normal without murmurs, rubs or gallops noted.    Abdomen: Soft, non-tender and non-distended.   Extremities: There is no obvious edema.     Skin: Warm and dry without trophic changes noted.    Musculoskeletal: exam reveals no obvious joint deformities.    Neurologically:  Mental status: The patient is awake, alert and oriented in all 4 spheres. Her immediate and remote memory, attention, language skills and fund of knowledge are appropriate. There is no evidence of aphasia, agnosia, apraxia or anomia. Speech is clear with normal prosody and enunciation. Thought  process is linear. Mood is normal and affect  is normal.  Cranial nerves II - XII are as described above under HEENT exam.  Motor exam: Normal bulk, strength and tone is noted. There is no obvious tremor. Fine motor skills and coordination: grossly intact.  Cerebellar testing: No dysmetria or intention tremor. There is no truncal or gait ataxia.  Sensory exam: intact to light touch in the upper and lower extremities.  Gait, station and balance: She stands easily. No veering to one side is noted. No leaning to one side is noted. Posture is age-appropriate and stance is narrow based. Gait shows normal stride length and normal pace. No problems turning are noted.    Assessment and Plan:  In summary, Dawn Baker is a very pleasant 71 year old female with an underlying medical history of hypertension, osteopenia, prediabetes and borderline overweight state, who presents for follow-up consultation of her obstructive sleep apnea.  She had a home sleep test on 02/22/2022 which indicated moderate obstructive sleep apnea with an AHI of 16.2/h, O2 nadir 85% with intermittent mild to moderate snoring.  She had an updated AutoPap machine but had not used it regularly.  She was advised to be consistent with her usage.  She reestablish treatment with AutoPap therapy at a pressure of 5 to 12 cm.  She is still adjusting to treatment, is not fully there yet with respect to full compliance but does indicate doing a little better.  Her AHI is at goal at 0.9/h.  She has a tendency to take the mask off in the middle of the night.  She is advised to try to be persistent with her AutoPap use, with time she may be more consistent with her ability to sleep with it.  She uses nasal pillows, she averages just a little bit less than 4 hours for most nights at this time.  She is advised to follow-up routinely to see one of our nurse practitioners in about 6 months, sooner if needed.  She is motivated to continue with treatment.  I answered all her questions today and the  patient was in agreement. I spent 25 minutes in total face-to-face time and in reviewing records during pre-charting, more than 50% of which was spent in counseling and coordination of care, reviewing test results, reviewing medications and treatment regimen and/or in discussing or reviewing the diagnosis of OSA, the prognosis and treatment options. Pertinent laboratory and imaging test results that were available during this visit with the patient were reviewed by me and considered in my medical decision making (see chart for details).

## 2022-05-11 DIAGNOSIS — M9905 Segmental and somatic dysfunction of pelvic region: Secondary | ICD-10-CM | POA: Diagnosis not present

## 2022-05-11 DIAGNOSIS — M9903 Segmental and somatic dysfunction of lumbar region: Secondary | ICD-10-CM | POA: Diagnosis not present

## 2022-05-11 DIAGNOSIS — M5136 Other intervertebral disc degeneration, lumbar region: Secondary | ICD-10-CM | POA: Diagnosis not present

## 2022-05-11 DIAGNOSIS — M9904 Segmental and somatic dysfunction of sacral region: Secondary | ICD-10-CM | POA: Diagnosis not present

## 2022-05-12 DIAGNOSIS — M25552 Pain in left hip: Secondary | ICD-10-CM | POA: Diagnosis not present

## 2022-05-19 DIAGNOSIS — M9905 Segmental and somatic dysfunction of pelvic region: Secondary | ICD-10-CM | POA: Diagnosis not present

## 2022-05-19 DIAGNOSIS — M9903 Segmental and somatic dysfunction of lumbar region: Secondary | ICD-10-CM | POA: Diagnosis not present

## 2022-05-19 DIAGNOSIS — M25552 Pain in left hip: Secondary | ICD-10-CM | POA: Diagnosis not present

## 2022-05-19 DIAGNOSIS — M5136 Other intervertebral disc degeneration, lumbar region: Secondary | ICD-10-CM | POA: Diagnosis not present

## 2022-05-19 DIAGNOSIS — M9904 Segmental and somatic dysfunction of sacral region: Secondary | ICD-10-CM | POA: Diagnosis not present

## 2022-05-26 DIAGNOSIS — M25552 Pain in left hip: Secondary | ICD-10-CM | POA: Diagnosis not present

## 2022-06-02 DIAGNOSIS — M25552 Pain in left hip: Secondary | ICD-10-CM | POA: Diagnosis not present

## 2022-06-04 DIAGNOSIS — R1013 Epigastric pain: Secondary | ICD-10-CM | POA: Diagnosis not present

## 2022-06-04 DIAGNOSIS — K219 Gastro-esophageal reflux disease without esophagitis: Secondary | ICD-10-CM | POA: Diagnosis not present

## 2022-06-04 DIAGNOSIS — I2510 Atherosclerotic heart disease of native coronary artery without angina pectoris: Secondary | ICD-10-CM | POA: Diagnosis not present

## 2022-06-04 DIAGNOSIS — I10 Essential (primary) hypertension: Secondary | ICD-10-CM | POA: Diagnosis not present

## 2022-06-18 DIAGNOSIS — M25552 Pain in left hip: Secondary | ICD-10-CM | POA: Diagnosis not present

## 2022-06-23 ENCOUNTER — Ambulatory Visit
Admission: RE | Admit: 2022-06-23 | Discharge: 2022-06-23 | Disposition: A | Discharge status: Home / Self Care | Payer: No Typology Code available for payment source | Source: Ambulatory Visit | Attending: Internal Medicine | Admitting: Internal Medicine

## 2022-06-23 DIAGNOSIS — I10 Essential (primary) hypertension: Secondary | ICD-10-CM

## 2022-06-29 DIAGNOSIS — M25552 Pain in left hip: Secondary | ICD-10-CM | POA: Diagnosis not present

## 2022-07-19 DIAGNOSIS — D1801 Hemangioma of skin and subcutaneous tissue: Secondary | ICD-10-CM | POA: Diagnosis not present

## 2022-07-19 DIAGNOSIS — L820 Inflamed seborrheic keratosis: Secondary | ICD-10-CM | POA: Diagnosis not present

## 2022-07-19 DIAGNOSIS — L821 Other seborrheic keratosis: Secondary | ICD-10-CM | POA: Diagnosis not present

## 2022-07-19 DIAGNOSIS — L814 Other melanin hyperpigmentation: Secondary | ICD-10-CM | POA: Diagnosis not present

## 2022-07-26 DIAGNOSIS — M9903 Segmental and somatic dysfunction of lumbar region: Secondary | ICD-10-CM | POA: Diagnosis not present

## 2022-07-26 DIAGNOSIS — M9905 Segmental and somatic dysfunction of pelvic region: Secondary | ICD-10-CM | POA: Diagnosis not present

## 2022-07-26 DIAGNOSIS — M5136 Other intervertebral disc degeneration, lumbar region: Secondary | ICD-10-CM | POA: Diagnosis not present

## 2022-07-26 DIAGNOSIS — M9904 Segmental and somatic dysfunction of sacral region: Secondary | ICD-10-CM | POA: Diagnosis not present

## 2022-07-29 DIAGNOSIS — M5136 Other intervertebral disc degeneration, lumbar region: Secondary | ICD-10-CM | POA: Diagnosis not present

## 2022-07-29 DIAGNOSIS — M9904 Segmental and somatic dysfunction of sacral region: Secondary | ICD-10-CM | POA: Diagnosis not present

## 2022-07-29 DIAGNOSIS — M9903 Segmental and somatic dysfunction of lumbar region: Secondary | ICD-10-CM | POA: Diagnosis not present

## 2022-07-29 DIAGNOSIS — M9905 Segmental and somatic dysfunction of pelvic region: Secondary | ICD-10-CM | POA: Diagnosis not present

## 2022-08-19 DIAGNOSIS — M9905 Segmental and somatic dysfunction of pelvic region: Secondary | ICD-10-CM | POA: Diagnosis not present

## 2022-08-19 DIAGNOSIS — M5136 Other intervertebral disc degeneration, lumbar region: Secondary | ICD-10-CM | POA: Diagnosis not present

## 2022-08-19 DIAGNOSIS — M9903 Segmental and somatic dysfunction of lumbar region: Secondary | ICD-10-CM | POA: Diagnosis not present

## 2022-08-19 DIAGNOSIS — M9904 Segmental and somatic dysfunction of sacral region: Secondary | ICD-10-CM | POA: Diagnosis not present

## 2022-10-12 ENCOUNTER — Ambulatory Visit
Admission: RE | Admit: 2022-10-12 | Discharge: 2022-10-12 | Disposition: A | Discharge status: Home / Self Care | Payer: Medicare Other | Source: Ambulatory Visit | Attending: Internal Medicine | Admitting: Internal Medicine

## 2022-10-12 DIAGNOSIS — M8589 Other specified disorders of bone density and structure, multiple sites: Secondary | ICD-10-CM | POA: Diagnosis not present

## 2022-10-12 DIAGNOSIS — G4733 Obstructive sleep apnea (adult) (pediatric): Secondary | ICD-10-CM | POA: Diagnosis not present

## 2022-10-12 DIAGNOSIS — Z780 Asymptomatic menopausal state: Secondary | ICD-10-CM | POA: Diagnosis not present

## 2022-10-12 DIAGNOSIS — M8588 Other specified disorders of bone density and structure, other site: Secondary | ICD-10-CM

## 2022-10-28 DIAGNOSIS — Z713 Dietary counseling and surveillance: Secondary | ICD-10-CM | POA: Diagnosis not present

## 2022-10-28 DIAGNOSIS — I2510 Atherosclerotic heart disease of native coronary artery without angina pectoris: Secondary | ICD-10-CM | POA: Diagnosis not present

## 2022-10-28 DIAGNOSIS — M8580 Other specified disorders of bone density and structure, unspecified site: Secondary | ICD-10-CM | POA: Diagnosis not present

## 2022-10-28 DIAGNOSIS — E785 Hyperlipidemia, unspecified: Secondary | ICD-10-CM | POA: Diagnosis not present

## 2022-10-28 DIAGNOSIS — I10 Essential (primary) hypertension: Secondary | ICD-10-CM | POA: Diagnosis not present

## 2022-10-28 DIAGNOSIS — R7303 Prediabetes: Secondary | ICD-10-CM | POA: Diagnosis not present

## 2022-11-02 DIAGNOSIS — M1612 Unilateral primary osteoarthritis, left hip: Secondary | ICD-10-CM | POA: Diagnosis not present

## 2022-11-03 DIAGNOSIS — M5136 Other intervertebral disc degeneration, lumbar region: Secondary | ICD-10-CM | POA: Diagnosis not present

## 2022-11-03 DIAGNOSIS — M9903 Segmental and somatic dysfunction of lumbar region: Secondary | ICD-10-CM | POA: Diagnosis not present

## 2022-11-03 DIAGNOSIS — M9905 Segmental and somatic dysfunction of pelvic region: Secondary | ICD-10-CM | POA: Diagnosis not present

## 2022-11-03 DIAGNOSIS — M9904 Segmental and somatic dysfunction of sacral region: Secondary | ICD-10-CM | POA: Diagnosis not present

## 2022-11-05 DIAGNOSIS — M1612 Unilateral primary osteoarthritis, left hip: Secondary | ICD-10-CM | POA: Diagnosis not present

## 2022-11-10 ENCOUNTER — Ambulatory Visit: Payer: Medicare Other | Admitting: Neurology

## 2022-11-10 ENCOUNTER — Encounter: Payer: Self-pay | Admitting: Neurology

## 2022-11-10 VITALS — BP 110/74 | HR 65 | Ht 67.0 in | Wt 170.0 lb

## 2022-11-10 DIAGNOSIS — Z789 Other specified health status: Secondary | ICD-10-CM | POA: Diagnosis not present

## 2022-11-10 DIAGNOSIS — G4733 Obstructive sleep apnea (adult) (pediatric): Secondary | ICD-10-CM | POA: Diagnosis not present

## 2022-11-10 NOTE — Patient Instructions (Addendum)
Please continue using your autoPAP regularly. While your insurance requires that you use PAP at least 4 hours each night on 70% of the nights, I recommend, that you not skip any nights and use it throughout the night if you can. Getting used to PAP and staying with the treatment long term does take time and patience and discipline. Untreated obstructive sleep apnea when it is moderate to severe can have an adverse impact on cardiovascular health and raise her risk for heart disease, arrhythmias, hypertension, congestive heart failure, stroke and diabetes. Untreated obstructive sleep apnea causes sleep disruption, nonrestorative sleep, and sleep deprivation. This can have an impact on your day to day functioning and cause daytime sleepiness and impairment of cognitive function, memory loss, mood disturbance, and problems focussing. Using PAP regularly can improve these symptoms.  Avoid Benadryl or diphenhydramine for sleep.  You can try Melatonin at night for sleep: take 1 mg to 3 mg, one to 2 hours before your bedtime. You can go up to 5 mg if needed. It is over the counter and comes in pill form, chewable form and spray, if you prefer.

## 2022-11-10 NOTE — Progress Notes (Signed)
Subjective:    Patient ID: Dawn Baker is a 72 y.o. female.  HPI     Interim history:   Dawn Baker is a 72 year old right-handed woman with an underlying medical history of hypertension, osteopenia, prediabetes and borderline overweight state, who presents for follow-up consultation of her obstructive sleep apnea, on AutoPap therapy.  The patient is unaccompanied today. I last saw her on 05/10/2022, at which time she was not fully compliant with her AutoPap machine.  She was trying to be more consistent with her usage but had a tendency to pull the mask off in the middle of the night.  Today, 11/10/2022: I reviewed her AutoPap compliance data from 10/10/2022 through 11/08/2022, which is a total of 30 days, during which time she used her machine 27 days with percent use days greater than 4 hours at 47%, indicating suboptimal compliance, average usage of 4 hours and 18 minutes, residual AHI at goal at 0.5/h, average pressure for the 95th percentile at  7.6 cm, range of 5 to 12 cm with EPR of 3.  Leak acceptable with the 95th percentile at 6.9 L/min.  She reports doing a little better overall, feels better rested, does put the mask on but does not always keep it on, has still a tendency to pull it off at night.  Sometimes when she wakes up in the middle of the night she does not actually put her back on.  She takes simply sleep at night for sleep, takes 2 pills each night.  Of note, she has had some sleep talking and sleepwalking episodes.   The patient's allergies, current medications, family history, past medical history, past social history, past surgical history and problem list were reviewed and updated as appropriate.   Previously:   I reviewed her AutoPap compliance data from 04/06/2022 through 05/05/2022, which is a total of 30 days, during which time she used her machine 23 days with percent use days greater than 4 hours at 33%, indicating suboptimal compliance with an average usage for  days on treatment of 3 hours and 44 minutes, residual AHI at goal at 0.9/h, average pressure for the 95th percentile at 7.7 cm with a range of 5 to 12 cm with EPR of 3.  Leak acceptable with the 95th percentile at 15.4 L/min.     I first met her at the request of her primary care physician on 02/01/2022, at which time she reported a prior diagnosis of obstructive sleep apnea.  She had difficulty tolerating her AutoPap at the time and had not used it recently.  She was advised to proceed with a sleep study.  She had a home sleep test on 02/22/2022 which showed moderate obstructive sleep apnea with an AHI of 16.2/h, O2 nadir 85% with intermittent mild to moderate snoring detected.  She was advised to proceed with AutoPap therapy.  She did not qualify for a new machine.  She did restart treatment on 03/31/2022.         02/01/22: (She) was previously diagnosed with obstructive sleep apnea and placed on AutoPap therapy.  I reviewed your office note from 11/20/2021.  Prior sleep study results are not available for my review today.  She reports having had several sleep studies.  In the beginning, she had at least one home sleep test which was difficult for her and then she had a laboratory sleep study and it showed mild sleep apnea from what she recalls.  She then had a sleep study about 4  years ago, also in the sleep lab, she was still residing in [place], [place] at the time and was diagnosed with sleep apnea and was advised to start AutoPap therapy.  She reports a strong family history of sleep apnea affecting her 3 brothers and probably also her father but he was not formally tested.  She had trouble with nasal pillows as they cause soreness in her nostrils, she has tried a nasal mask.  She has not received any recent supplies and needs to establish with a local DME company.  She does not have any reports of her sleep studies with her or at home as she recalls but we will try to find reports.  We will also call  your office for additional records if possible.  I was able to review her AutoPap compliance data for the past 1 year.  She has sporadically used her machine in the past year, last usage in October and November 2022.  Pressure ranges 5 to 12 cm, 95th percentile of pressure at 7.3 cm, leak on the higher side with the 95th percentile at 18.5 L/min in the month of October and November.  Total usage of 9 days out of 90 days between late August through late November 2022.  Average AHI was 1.2/h.  She moved from [place] to be closer to family.  Her 2 sons live in [place] and she has 2 grandchildren in [place].  Her Epworth sleepiness score is 8/24, fatigue severity score is 34 out of 63.  She lives with her husband.  She is a non-smoker.  She drinks alcohol on the weekends, 1 or 2 drinks on average.  She drinks caffeine in the form of coffee, 2 large cups per day on average.  She denies night to night nocturia or recurrent morning headaches.  When she used her AutoPap more consistently, she felt she benefited from and that her sleep was more restful and she had less daytime tiredness.  She is motivated to get back on her AutoPap machine.  She would like to avoid a new sleep study if possible.   Addendum, 02/02/2022: I received [facility] from [facility], [place], [place].  Patient had a CPAP titration study on 08/22/2018.  Sleep efficiency was 73.3%, sleep latency 11.7 minutes.  She had lack of REM sleep.  CPAP was titrated from 5 cm to 8 cm.  Her AHI remained 0/h.  She was advised to start CPAP therapy at 8 cm.   She had a home sleep test on 01/03/2018 which indicated an AHI of 6/h, average oxygen saturation 96%, nadir was 80%.  Test equipment was apnea link.   She had a baseline sleep study on 09/20/2017.  Sleep efficiency was 89.3%, sleep latency 4.5 minutes, REM latency 79 minutes.  AHI was 0/h.  Average oxygen saturation was 94%, nadir was 90%.   Her Past Medical History Is  Significant For: Past Medical History:  Diagnosis Date   High blood pressure    Osteopenia    Prediabetes     Her Past Surgical History Is Significant For: Past Surgical History:  Procedure Laterality Date   PARTIAL HYSTERECTOMY     ovaries remain 65 y of age    Her Family History Is Significant For: Family History  Problem Relation Age of Onset   Osteoarthritis Mother    Hypertension Father    Sleep apnea Father    Heart disease Father    Heart attack Father    Healthy Sister  Sleep apnea Brother    Stroke Brother    Sleep apnea Brother    Valvular heart disease Brother    Congestive Heart Failure Brother    Sleep apnea Brother    Healthy Son    Healthy Son    Breast cancer Neg Hx    Colon cancer Neg Hx    Lung cancer Neg Hx    Diabetes Neg Hx     Her Social History Is Significant For: Social History   Socioeconomic History   Marital status: Married    Spouse name: Not on file   Number of children: Not on file   Years of education: Not on file   Highest education level: Not on file  Occupational History   Not on file  Tobacco Use   Smoking status: Never   Smokeless tobacco: Never  Vaping Use   Vaping Use: Never used  Substance and Sexual Activity   Alcohol use: Yes    Comment: socially   Drug use: Never   Sexual activity: Not on file  Other Topics Concern   Not on file  Social History Narrative   Caffeine: 2 cups.  Education [employer] grad.  Retired.     Right handed   Social Determinants of Health   Financial Resource Strain: Not on file  Food Insecurity: Not on file  Transportation Needs: Not on file  Physical Activity: Not on file  Stress: Not on file  Social Connections: Not on file    Her Allergies Are:  Allergies  Allergen Reactions   Amoxicillin Hives   Sulfa Antibiotics Hives  :   Her Current Medications Are:  Outpatient Encounter Medications as of 11/10/2022  Medication Sig   BAYER ASPIRIN PO Take by mouth.    CALCIUM-VITAMIN D PO Take 1 tablet by mouth daily.   diphenhydrAMINE (SIMPLY SLEEP) 25 MG tablet 50 mg.   hydrochlorothiazide (HYDRODIURIL) 25 MG tablet Take 25 mg by mouth every morning.   Multiple Vitamin (MULTI VITAMIN) TABS 1 tablet Orally Once a day   rosuvastatin (CRESTOR) 10 MG tablet Take 10 mg by mouth daily.   VITAMIN D PO Take 1 tablet by mouth daily.   No facility-administered encounter medications on file as of 11/10/2022.  :  Review of Systems:  Out of a complete 14 point review of systems, all are reviewed and negative with the exception of these symptoms as listed below:  Review of Systems  Neurological:        Pt here for CPAP f/u Pt states some nights she tries to get out of bed and says get of here. Pt has no memory of this episode   ESS: 7     Objective:  Neurological Exam  Physical Exam Physical Examination:   Vitals:   11/10/22 1050  BP: 110/74  Pulse: 65    General Examination: The patient is a very pleasant 71 y.o. female in no acute distress. She appears well-developed and well-nourished and well groomed.   HEENT: Normocephalic, atraumatic, pupils are equal, round and reactive to light, extraocular tracking is good without limitation to gaze excursion or nystagmus noted. Hearing is grossly intact. Face is symmetric with normal facial animation. Speech is clear with no dysarthria noted. There is no hypophonia. There is no lip, neck/head, jaw or voice tremor. Neck is supple with full range of passive and active motion. There are no carotid bruits on auscultation. Oropharynx exam reveals: mild mouth dryness, adequate dental hygiene, mild airway crowding, tongue protrudes centrally  and palate elevates symmetrically.    Chest: Clear to auscultation without wheezing, rhonchi or crackles noted.   Heart: S1+S2+0, regular and normal without murmurs, rubs or gallops noted.    Abdomen: Soft, non-tender and non-distended.   Extremities: There is no obvious  edema.     Skin: Warm and dry without trophic changes noted.    Musculoskeletal: exam reveals no obvious joint deformities.    Neurologically:  Mental status: The patient is awake, alert and oriented in all 4 spheres. Her immediate and remote memory, attention, language skills and fund of knowledge are appropriate. There is no evidence of aphasia, agnosia, apraxia or anomia. Speech is clear with normal prosody and enunciation. Thought process is linear. Mood is normal and affect is normal.  Cranial nerves II - XII are as described above under HEENT exam.  Motor exam: Normal bulk, strength and tone is noted. There is no obvious tremor. Fine motor skills and coordination: grossly intact.  Cerebellar testing: No dysmetria or intention tremor. There is no truncal or gait ataxia.  Sensory exam: intact to light touch in the upper and lower extremities.  Gait, station and balance: She stands easily. No veering to one side is noted. No leaning to one side is noted. Posture is age-appropriate and stance is narrow based. Gait shows normal stride length and normal pace. No problems turning are noted.    Assessment and Plan:  In summary, Dawn Baker is a very pleasant 72 year old female with an underlying medical history of hypertension, osteopenia, prediabetes and borderline overweight state, who presents for follow-up consultation of her obstructive sleep apnea.  She had a home sleep test on 02/22/2022 which indicated moderate obstructive sleep apnea with an AHI of 16.2/h, O2 nadir 85% with intermittent mild to moderate snoring.  She is fairly consistent with putting the AutoPap on but does not keep it on all night, even if she wakes up and realizes she has pulled it off she does not always put it back on.  She is encouraged to try to put it back on when she realizes she has taken it off at night, she is endorsing improvement in sleep quality.  She uses simply sleep at night, she is discouraged from using  Benadryl at night due to history of sleepwalking and sleep talking episodes.  She is advised that she could use melatonin at night.  She is advised to be consistent with her AutoPap and follow-up routinely in sleep clinic in about 1 year, sooner if needed.  She can see one of our nurse practitioners next time.  I answered all her questions today and she was in agreement.  I spent 20 minutes in total face-to-face time and in reviewing records during pre-charting, more than 50% of which was spent in counseling and coordination of care, reviewing test results, reviewing medications and treatment regimen and/or in discussing or reviewing the diagnosis of OSA, the prognosis and treatment options. Pertinent laboratory and imaging test results that were available during this visit with the patient were reviewed by me and considered in my medical decision making (see chart for details).

## 2022-11-30 DIAGNOSIS — M1612 Unilateral primary osteoarthritis, left hip: Secondary | ICD-10-CM | POA: Diagnosis not present

## 2022-11-30 IMAGING — MG MM DIGITAL SCREENING BILAT W/ TOMO AND CAD
8 series · 8 of 24 positions shown · non-contrast
Comparison: Previous exam(s).

CLINICAL DATA: Screening.

EXAM:
DIGITAL SCREENING BILATERAL MAMMOGRAM WITH TOMOSYNTHESIS AND CAD
TECHNIQUE: Bilateral screening digital craniocaudal and mediolateral oblique
mammograms were obtained. Bilateral screening digital breast
tomosynthesis was performed. The images were evaluated with
computer-aided detection.

[L MLO synth-2D]
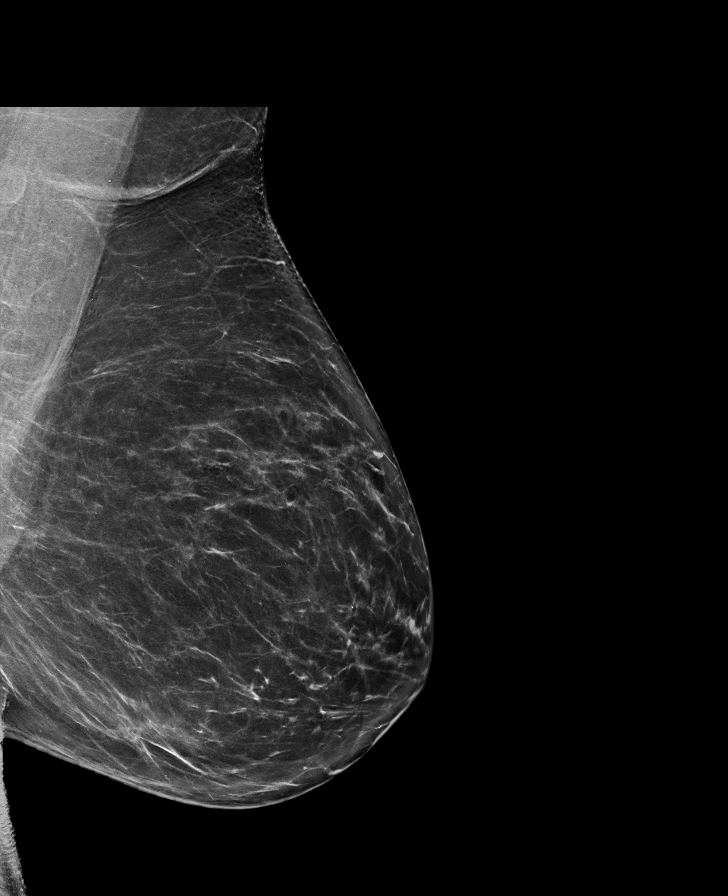

[R CC synth-2D]
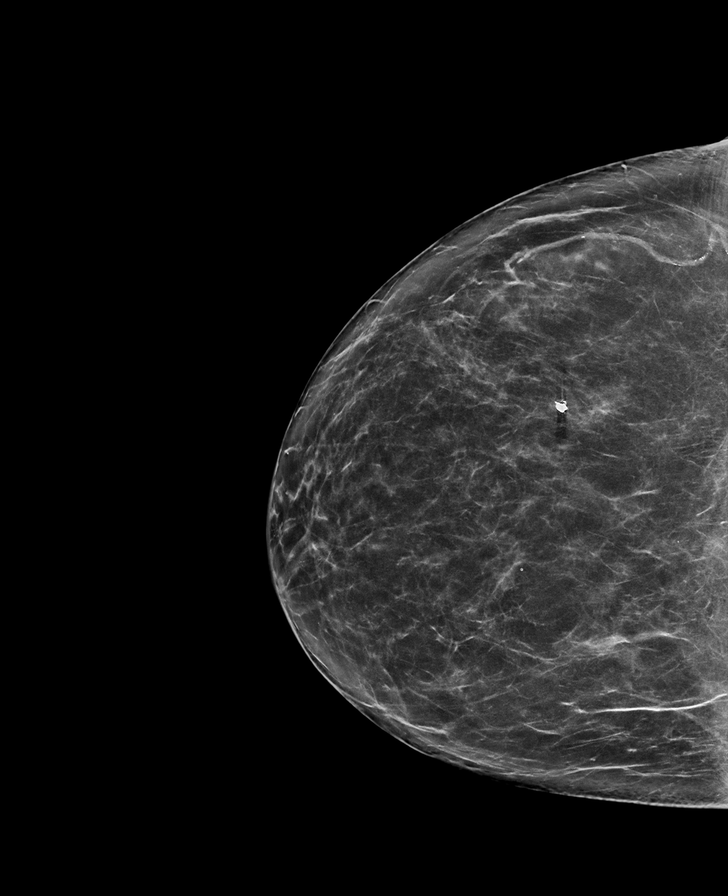

[R MLO synth-2D]
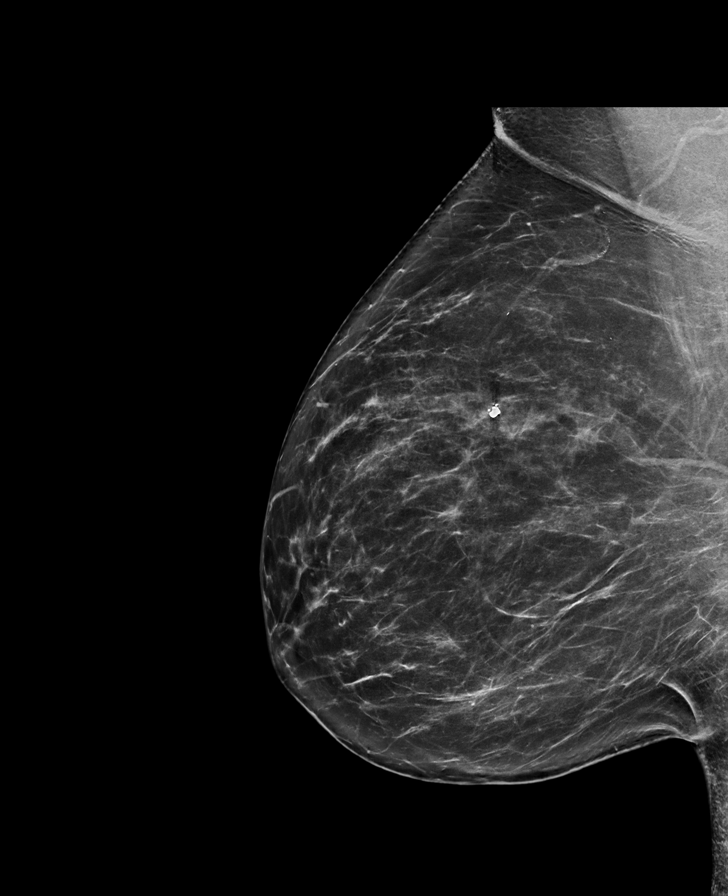

[L CC synth-2D]
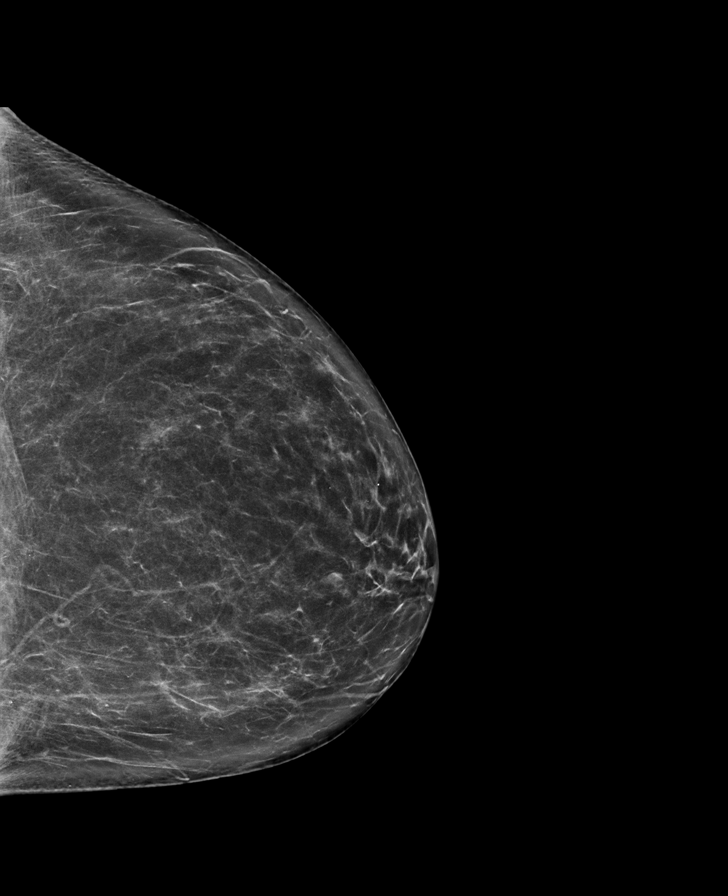

[R MLO tomo · tomo slice 43/84.0]
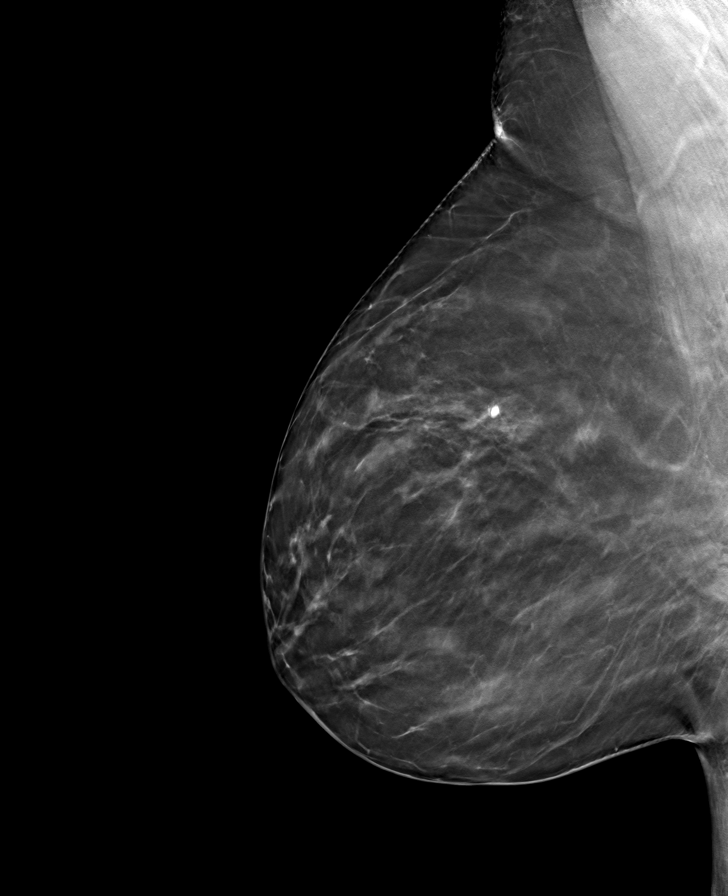

[L CC tomo · tomo slice 39/76.0]
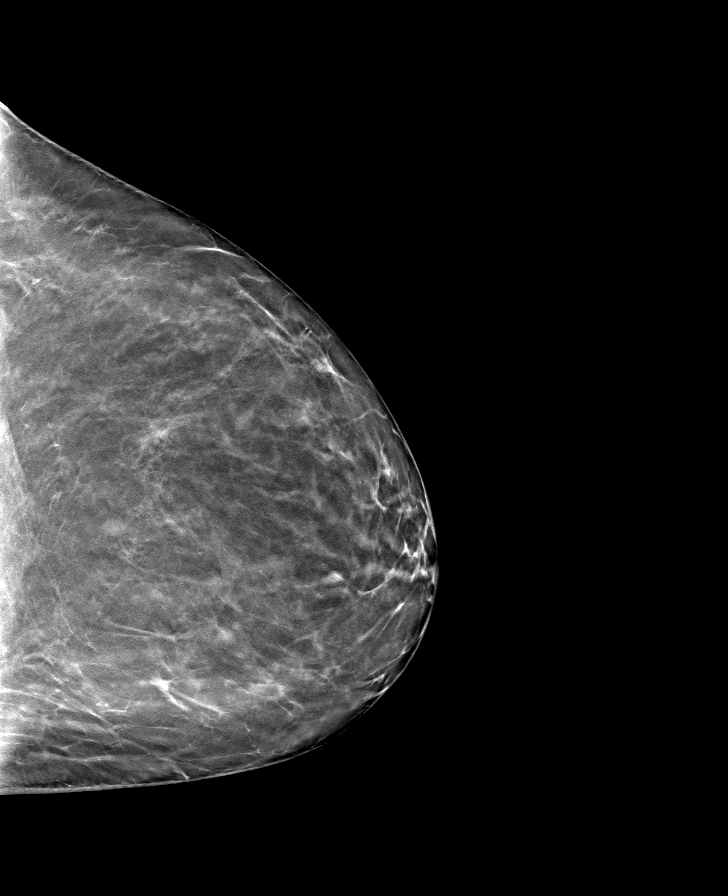

[L MLO tomo · tomo slice 42/83.0]
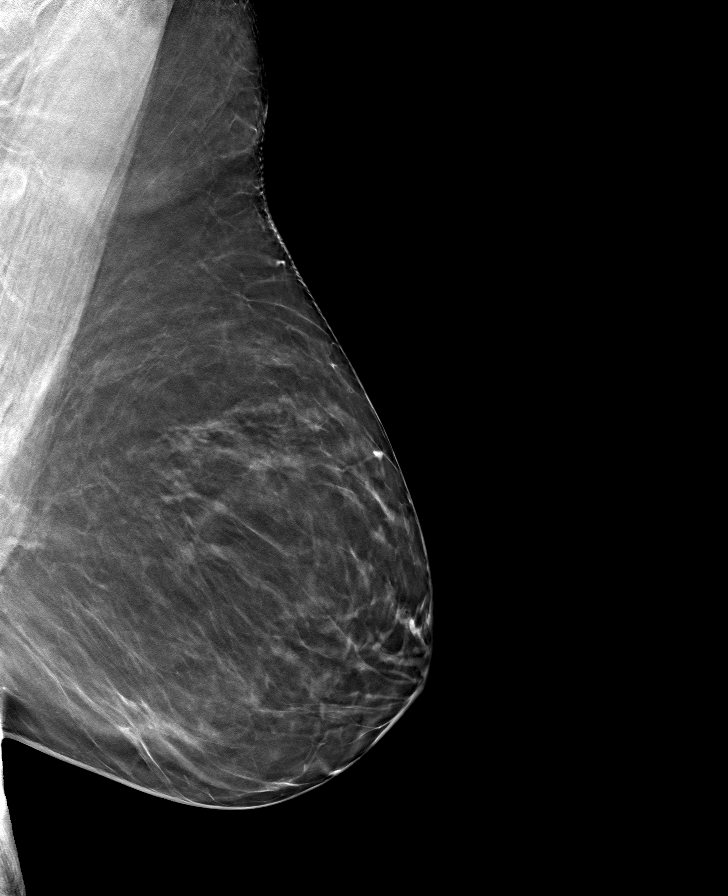

[R CC tomo · tomo slice 39/78.0]
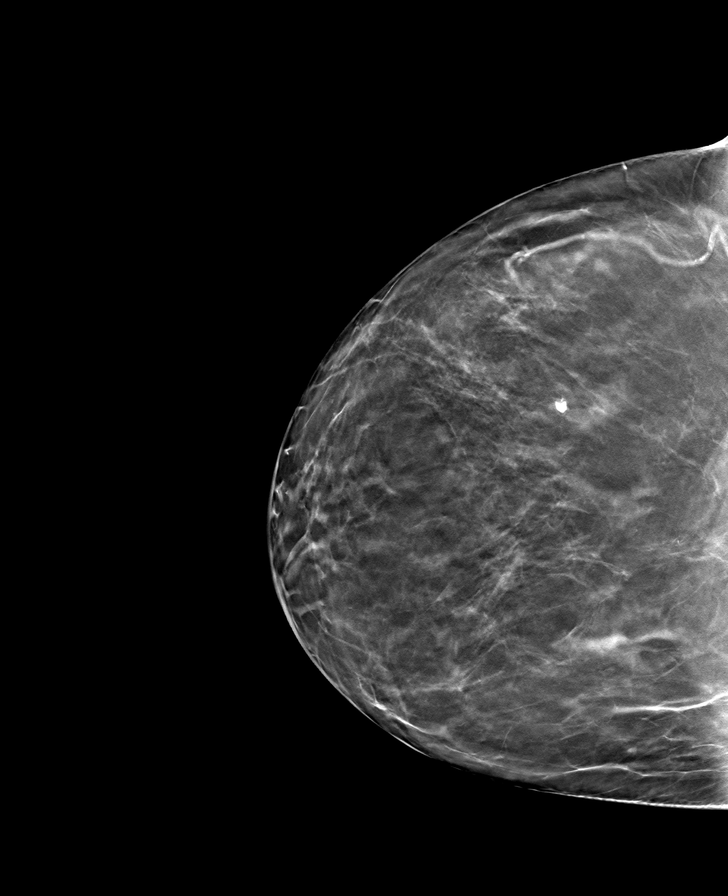

[8 of 24 positions shown; findings below may reference images not displayed]

ACR Breast Density Category b: There are scattered areas of
fibroglandular density.
FINDINGS: There are no findings suspicious for malignancy.
IMPRESSION: No mammographic evidence of malignancy. A result letter of this
screening mammogram will be mailed directly to the patient.

RECOMMENDATION:
Screening mammogram in one year. (Code:51-O-LD2)

BI-RADS CATEGORY  1: Negative.

## 2022-12-03 ENCOUNTER — Other Ambulatory Visit: Payer: Self-pay | Admitting: Internal Medicine

## 2022-12-03 DIAGNOSIS — Z1231 Encounter for screening mammogram for malignant neoplasm of breast: Secondary | ICD-10-CM

## 2022-12-06 DIAGNOSIS — M1612 Unilateral primary osteoarthritis, left hip: Secondary | ICD-10-CM | POA: Diagnosis not present

## 2022-12-28 DIAGNOSIS — M9903 Segmental and somatic dysfunction of lumbar region: Secondary | ICD-10-CM | POA: Diagnosis not present

## 2022-12-28 DIAGNOSIS — M5136 Other intervertebral disc degeneration, lumbar region: Secondary | ICD-10-CM | POA: Diagnosis not present

## 2022-12-28 DIAGNOSIS — M9904 Segmental and somatic dysfunction of sacral region: Secondary | ICD-10-CM | POA: Diagnosis not present

## 2022-12-28 DIAGNOSIS — M9905 Segmental and somatic dysfunction of pelvic region: Secondary | ICD-10-CM | POA: Diagnosis not present

## 2022-12-30 DIAGNOSIS — H5203 Hypermetropia, bilateral: Secondary | ICD-10-CM | POA: Diagnosis not present

## 2022-12-31 DIAGNOSIS — H524 Presbyopia: Secondary | ICD-10-CM | POA: Diagnosis not present

## 2023-01-03 DIAGNOSIS — K080 Exfoliation of teeth due to systemic causes: Secondary | ICD-10-CM | POA: Diagnosis not present

## 2023-01-18 ENCOUNTER — Ambulatory Visit
Admission: RE | Admit: 2023-01-18 | Discharge: 2023-01-18 | Disposition: A | Discharge status: Home / Self Care | Payer: Medicare Other | Source: Ambulatory Visit | Attending: Internal Medicine | Admitting: Internal Medicine

## 2023-01-18 DIAGNOSIS — Z1231 Encounter for screening mammogram for malignant neoplasm of breast: Secondary | ICD-10-CM | POA: Diagnosis not present

## 2023-03-01 DIAGNOSIS — M9903 Segmental and somatic dysfunction of lumbar region: Secondary | ICD-10-CM | POA: Diagnosis not present

## 2023-03-01 DIAGNOSIS — M9905 Segmental and somatic dysfunction of pelvic region: Secondary | ICD-10-CM | POA: Diagnosis not present

## 2023-03-01 DIAGNOSIS — M5136 Other intervertebral disc degeneration, lumbar region: Secondary | ICD-10-CM | POA: Diagnosis not present

## 2023-03-01 DIAGNOSIS — M9904 Segmental and somatic dysfunction of sacral region: Secondary | ICD-10-CM | POA: Diagnosis not present

## 2023-04-11 DIAGNOSIS — M5136 Other intervertebral disc degeneration, lumbar region: Secondary | ICD-10-CM | POA: Diagnosis not present

## 2023-04-11 DIAGNOSIS — M9905 Segmental and somatic dysfunction of pelvic region: Secondary | ICD-10-CM | POA: Diagnosis not present

## 2023-04-11 DIAGNOSIS — M9903 Segmental and somatic dysfunction of lumbar region: Secondary | ICD-10-CM | POA: Diagnosis not present

## 2023-04-11 DIAGNOSIS — M9904 Segmental and somatic dysfunction of sacral region: Secondary | ICD-10-CM | POA: Diagnosis not present

## 2023-04-27 DIAGNOSIS — M1612 Unilateral primary osteoarthritis, left hip: Secondary | ICD-10-CM | POA: Diagnosis not present

## 2023-05-19 DIAGNOSIS — M8580 Other specified disorders of bone density and structure, unspecified site: Secondary | ICD-10-CM | POA: Diagnosis not present

## 2023-05-19 DIAGNOSIS — E785 Hyperlipidemia, unspecified: Secondary | ICD-10-CM | POA: Diagnosis not present

## 2023-05-27 DIAGNOSIS — I2510 Atherosclerotic heart disease of native coronary artery without angina pectoris: Secondary | ICD-10-CM | POA: Diagnosis not present

## 2023-05-27 DIAGNOSIS — Z23 Encounter for immunization: Secondary | ICD-10-CM | POA: Diagnosis not present

## 2023-05-27 DIAGNOSIS — Z0000 Encounter for general adult medical examination without abnormal findings: Secondary | ICD-10-CM | POA: Diagnosis not present

## 2023-05-27 DIAGNOSIS — Z1339 Encounter for screening examination for other mental health and behavioral disorders: Secondary | ICD-10-CM | POA: Diagnosis not present

## 2023-05-27 DIAGNOSIS — Z1331 Encounter for screening for depression: Secondary | ICD-10-CM | POA: Diagnosis not present

## 2023-05-27 DIAGNOSIS — I10 Essential (primary) hypertension: Secondary | ICD-10-CM | POA: Diagnosis not present

## 2023-06-06 DIAGNOSIS — M9905 Segmental and somatic dysfunction of pelvic region: Secondary | ICD-10-CM | POA: Diagnosis not present

## 2023-06-06 DIAGNOSIS — M5136 Other intervertebral disc degeneration, lumbar region: Secondary | ICD-10-CM | POA: Diagnosis not present

## 2023-06-06 DIAGNOSIS — M9903 Segmental and somatic dysfunction of lumbar region: Secondary | ICD-10-CM | POA: Diagnosis not present

## 2023-06-06 DIAGNOSIS — M9904 Segmental and somatic dysfunction of sacral region: Secondary | ICD-10-CM | POA: Diagnosis not present

## 2023-06-20 DIAGNOSIS — M1612 Unilateral primary osteoarthritis, left hip: Secondary | ICD-10-CM | POA: Diagnosis not present

## 2023-06-20 DIAGNOSIS — M25752 Osteophyte, left hip: Secondary | ICD-10-CM | POA: Diagnosis not present

## 2023-07-29 ENCOUNTER — Other Ambulatory Visit (HOSPITAL_COMMUNITY): Payer: Self-pay

## 2023-07-29 DIAGNOSIS — I700 Atherosclerosis of aorta: Secondary | ICD-10-CM | POA: Diagnosis not present

## 2023-07-29 DIAGNOSIS — I10 Essential (primary) hypertension: Secondary | ICD-10-CM | POA: Diagnosis not present

## 2023-07-29 MED ORDER — WEGOVY 0.25 MG/0.5ML ~~LOC~~ SOAJ
0.2500 mg | SUBCUTANEOUS | 0 refills | Status: AC
  Filled 2023-07-29 (×2): qty 2, 28d supply, fill #0

## 2023-08-08 DIAGNOSIS — Z96642 Presence of left artificial hip joint: Secondary | ICD-10-CM | POA: Diagnosis not present

## 2023-08-08 DIAGNOSIS — Z471 Aftercare following joint replacement surgery: Secondary | ICD-10-CM | POA: Diagnosis not present

## 2023-09-12 DIAGNOSIS — M9904 Segmental and somatic dysfunction of sacral region: Secondary | ICD-10-CM | POA: Diagnosis not present

## 2023-09-12 DIAGNOSIS — M5136 Other intervertebral disc degeneration, lumbar region with discogenic back pain only: Secondary | ICD-10-CM | POA: Diagnosis not present

## 2023-09-12 DIAGNOSIS — M9905 Segmental and somatic dysfunction of pelvic region: Secondary | ICD-10-CM | POA: Diagnosis not present

## 2023-09-12 DIAGNOSIS — M9903 Segmental and somatic dysfunction of lumbar region: Secondary | ICD-10-CM | POA: Diagnosis not present

## 2023-09-19 DIAGNOSIS — M9905 Segmental and somatic dysfunction of pelvic region: Secondary | ICD-10-CM | POA: Diagnosis not present

## 2023-09-19 DIAGNOSIS — M5136 Other intervertebral disc degeneration, lumbar region with discogenic back pain only: Secondary | ICD-10-CM | POA: Diagnosis not present

## 2023-09-19 DIAGNOSIS — M9903 Segmental and somatic dysfunction of lumbar region: Secondary | ICD-10-CM | POA: Diagnosis not present

## 2023-09-19 DIAGNOSIS — M9904 Segmental and somatic dysfunction of sacral region: Secondary | ICD-10-CM | POA: Diagnosis not present

## 2023-09-21 DIAGNOSIS — Z96642 Presence of left artificial hip joint: Secondary | ICD-10-CM | POA: Diagnosis not present

## 2023-10-26 NOTE — Therapy (Signed)
OUTPATIENT PHYSICAL THERAPY EVALUATION   Patient Name: Dawn Baker MRN: [id number] DOB:Mar 12, 1951, 73 y.o., female Today's Date: 10/27/2023  END OF SESSION:  PT End of Session - 10/27/23 1013     Visit Number 1    Number of Visits 2    Date for PT Re-Evaluation 11/24/23    Authorization Type [employer] [facility] adv.    PT Start Time 1012    PT Stop Time 1053    PT Time Calculation (min) 41 min    Activity Tolerance Patient tolerated treatment well    Behavior During Therapy WFL for tasks assessed/performed             Past Medical History:  Diagnosis Date   High blood pressure    Osteopenia    Prediabetes    Past Surgical History:  Procedure Laterality Date   PARTIAL HYSTERECTOMY     ovaries remain 49 y of age   There are no active problems to display for this patient.  REFERRING PROVIDER:  Durene Romans, MD    REFERRING DIAG:  [phone] (ICD-10-CM) - Presence of left artificial hip joint  Z47.1 (ICD-10-CM) - Aftercare following joint replacement surgery    Rationale for Evaluation and Treatment: Rehabilitation  THERAPY DIAG:  Pain in left hip  Chronic right shoulder pain  ONSET DATE: THA Oct 2024   SUBJECTIVE:                                                                                                                                                                                           SUBJECTIVE STATEMENT: Using cane really bothered my right shoulder- would like exercises to help with this.  Lifting my leg forward to get into/out of car and up stairs is tight.   PERTINENT HISTORY:  osteopenia  PAIN:  Are you having pain?  Just tightness  PRECAUTIONS:  None  RED FLAGS: None   WEIGHT BEARING RESTRICTIONS:  No  FALLS:  Has patient fallen in last 6 months? No   PLOF:  Independent  PATIENT GOALS:  Education/HEP    OBJECTIVE:  Note: Objective measures were completed at Evaluation unless otherwise noted.  COGNITIVE  STATUS: Within functional limits for tasks assessed   SENSATION: WFL  EDEMA:  No  POSTURE:  Forward head, rounded shoulders, anterior pelvis   GAIT: Comments: [facility]  TREATMENT DATE:   EVAL: Edu on scraping at home See HEP   PATIENT EDUCATION:  Education details: Anatomy of condition, POC, HEP, exercise form/rationale Person educated: Patient Education method: Explanation, Demonstration, Tactile cues, Verbal cues, and Handouts Education comprehension: verbalized understanding, returned demonstration, verbal cues required, tactile cues required, and needs further education  HOME EXERCISE PROGRAM: Access Code: [email or web address] URL: [email or web address]   ASSESSMENT:  CLINICAL IMPRESSION: Patient is a 73 y.o. F who was seen today for physical therapy evaluation and treatment for left hip pain following THA in Oct 2024. Pt c/o tightness in anterior hip at incision site as well as pain in right shoulder as  a result of using AD for hip. Pt demo full available ROM in all joints but has limited central stability in lumbopelvic region leading to compensation patterns along the biomechanical chain. HEP was created today and pt may f/u within the next month for changes/progressions but is also a member at [employer] and doing water aerobics. Encouraged her to reach out with any questions.       REHAB POTENTIAL: Good  CLINICAL DECISION MAKING: Stable/uncomplicated  EVALUATION COMPLEXITY: Low   GOALS: Goals reviewed with patient? Yes   LONG TERM GOALS: Target date: POC date  Pt will f/u with questions about HEP and adjustments will be made PRN Baseline:  Goal status: INITIAL     PLAN:  PT FREQUENCY: 1x/month  PT DURATION: other: POC date  PLANNED INTERVENTIONS: 97164- PT Re-evaluation, 97110-Therapeutic exercises, 97530- Therapeutic  activity, 97112- Neuromuscular re-education, 97535- Self Care, 96045- Manual therapy, [phone]- Gait training, [phone]- Aquatic Therapy, Patient/Family education, Balance training, Stair training, Taping, Dry Needling, Joint mobilization, Spinal mobilization, Cryotherapy, and Moist heat.  PLAN FOR NEXT SESSION: f/u if necessary in the next month.    Keoshia Steinmetz C. Tayana Shankle PT, DPT 10/27/23 12:01 PM

## 2023-10-27 ENCOUNTER — Encounter (HOSPITAL_BASED_OUTPATIENT_CLINIC_OR_DEPARTMENT_OTHER): Payer: Self-pay | Admitting: Physical Therapy

## 2023-10-27 ENCOUNTER — Ambulatory Visit (HOSPITAL_BASED_OUTPATIENT_CLINIC_OR_DEPARTMENT_OTHER): Payer: Medicare Other | Attending: Orthopedic Surgery | Admitting: Physical Therapy

## 2023-10-27 ENCOUNTER — Other Ambulatory Visit: Payer: Self-pay

## 2023-10-27 DIAGNOSIS — M25511 Pain in right shoulder: Secondary | ICD-10-CM | POA: Insufficient documentation

## 2023-10-27 DIAGNOSIS — M25552 Pain in left hip: Secondary | ICD-10-CM | POA: Diagnosis not present

## 2023-10-27 DIAGNOSIS — G8929 Other chronic pain: Secondary | ICD-10-CM | POA: Diagnosis not present

## 2023-11-08 NOTE — Progress Notes (Deleted)
 PATIENT: Dawn Baker DOB: Mar 13, 1951  REASON FOR VISIT: follow up HISTORY FROM: patient  No chief complaint on file.    HISTORY OF PRESENT ILLNESS:  11/08/23 ALL:  Dawn Baker is a 73 y.o. female here today for follow up for OSA on CPAP.      HISTORY: (copied from Dr Teofilo Pod previous note)  [name] is a 73 year old right-handed woman with an underlying medical history of hypertension, osteopenia, prediabetes and borderline overweight state, who presents for follow-up consultation of her obstructive sleep apnea, on AutoPap therapy.  The patient is unaccompanied today. I last saw her on 05/10/2022, at which time she was not fully compliant with her AutoPap machine.  She was trying to be more consistent with her usage but had a tendency to pull the mask off in the middle of the night.   Today, 11/10/2022: I reviewed her AutoPap compliance data from 10/10/2022 through 11/08/2022, which is a total of 30 days, during which time she used her machine 27 days with percent use days greater than 4 hours at 47%, indicating suboptimal compliance, average usage of 4 hours and 18 minutes, residual AHI at goal at 0.5/h, average pressure for the 95th percentile at  7.6 cm, range of 5 to 12 cm with EPR of 3.  Leak acceptable with the 95th percentile at 6.9 L/min.  She reports doing a little better overall, feels better rested, does put the mask on but does not always keep it on, has still a tendency to pull it off at night.  Sometimes when she wakes up in the middle of the night she does not actually put her back on.  She takes simply sleep at night for sleep, takes 2 pills each night.  Of note, she has had some sleep talking and sleepwalking episodes.    REVIEW OF SYSTEMS: Out of a complete 14 system review of symptoms, the patient complains only of the following symptoms, and all other reviewed systems are negative.  ESS:  ALLERGIES: Allergies  Allergen Reactions   Amoxicillin Hives    Sulfa Antibiotics Hives    HOME MEDICATIONS: Outpatient Medications Prior to Visit  Medication Sig Dispense Refill   BAYER ASPIRIN PO Take by mouth.     CALCIUM-VITAMIN D PO Take 1 tablet by mouth daily.     diphenhydrAMINE (SIMPLY SLEEP) 25 MG tablet 50 mg.     hydrochlorothiazide (HYDRODIURIL) 25 MG tablet Take 25 mg by mouth every morning.     Multiple Vitamin (MULTI VITAMIN) TABS 1 tablet Orally Once a day     rosuvastatin (CRESTOR) 10 MG tablet Take 10 mg by mouth daily.     Semaglutide-Weight Management (WEGOVY) 0.25 MG/0.5ML SOAJ Inject 0.25 mg into the skin once a week. 2 mL 0   VITAMIN D PO Take 1 tablet by mouth daily.     No facility-administered medications prior to visit.    PAST MEDICAL HISTORY: Past Medical History:  Diagnosis Date   High blood pressure    Osteopenia    Prediabetes     PAST SURGICAL HISTORY: Past Surgical History:  Procedure Laterality Date   PARTIAL HYSTERECTOMY     ovaries remain 42 y of age    FAMILY HISTORY: Family History  Problem Relation Age of Onset   Osteoarthritis Mother    Hypertension Father    Sleep apnea Father    Heart disease Father    Heart attack Father    Healthy Sister    Sleep  apnea Brother    Stroke Brother    Sleep apnea Brother    Valvular heart disease Brother    Congestive Heart Failure Brother    Sleep apnea Brother    Healthy Son    Healthy Son    Breast cancer Neg Hx    Colon cancer Neg Hx    Lung cancer Neg Hx    Diabetes Neg Hx     SOCIAL HISTORY: Social History   Socioeconomic History   Marital status: Married    Spouse name: Not on file   Number of children: Not on file   Years of education: Not on file   Highest education level: Not on file  Occupational History   Not on file  Tobacco Use   Smoking status: Never   Smokeless tobacco: Never  Vaping Use   Vaping status: Never Used  Substance and Sexual Activity   Alcohol use: Yes    Comment: socially   Drug use: Never   Sexual  activity: Not on file  Other Topics Concern   Not on file  Social History Narrative   Caffeine: 2 cups.  Education [employer] grad.  Retired.     Right handed   Social Drivers of Corporate investment banker Strain: Not on file  Food Insecurity: Not on file  Transportation Needs: Not on file  Physical Activity: Not on file  Stress: Not on file  Social Connections: Not on file  Intimate Partner Violence: Not on file     PHYSICAL EXAM  There were no vitals filed for this visit. There is no height or weight on file to calculate BMI.  Generalized: Well developed, in no acute distress  Cardiology: normal rate and rhythm, no murmur noted Respiratory: clear to auscultation bilaterally  Neurological examination  Mentation: Alert oriented to time, place, history taking. Follows all commands speech and language fluent Cranial nerve II-XII: Pupils were equal round reactive to light. Extraocular movements were full, visual field were full  Motor: The motor testing reveals 5 over 5 strength of all 4 extremities. Good symmetric motor tone is noted throughout.  Gait and station: Gait is normal.    DIAGNOSTIC DATA (LABS, IMAGING, TESTING) - I reviewed patient records, labs, notes, testing and imaging myself where available.      No data to display           No results found for: "WBC", "HGB", "HCT", "MCV", "PLT" No results found for: "NA", "K", "CL", "CO2", "GLUCOSE", "BUN", "CREATININE", "CALCIUM", "PROT", "ALBUMIN", "AST", "ALT", "ALKPHOS", "BILITOT", "GFRNONAA", "GFRAA" No results found for: "CHOL", "HDL", "LDLCALC", "LDLDIRECT", "TRIG", "CHOLHDL" No results found for: "HGBA1C" No results found for: "VITAMINB12" No results found for: "TSH"   ASSESSMENT AND PLAN 73 y.o. year old female  has a past medical history of High blood pressure, Osteopenia, and Prediabetes. here with   No diagnosis found.    Darnelle Going is doing well on CPAP therapy. Compliance report reveals  ***. *** was encouraged to continue using CPAP nightly and for greater than 4 hours each night. We will update supply orders as indicated. Risks of untreated sleep apnea review and education materials provided. Healthy lifestyle habits encouraged. *** will follow up in ***, sooner if needed. *** verbalizes understanding and agreement with this plan.    No orders of the defined types were placed in this encounter.    No orders of the defined types were placed in this encounter.     Holley Wirt, FNP-C  11/08/2023, 11:13 AM [facility] [address], [address] [place], [place] [address] [phone]

## 2023-11-09 ENCOUNTER — Telehealth: Payer: Self-pay

## 2023-11-09 NOTE — Telephone Encounter (Signed)
 If pt calls back please remind to bring power cord and machine

## 2023-11-09 NOTE — Telephone Encounter (Signed)
 LVM 1ST ATTEMPT AT 1:51PM:  Unable to pull data on resmed pt there no data other tha 4 days of usage in 90 days.

## 2023-11-10 ENCOUNTER — Ambulatory Visit: Payer: Medicare Other | Admitting: Family Medicine

## 2023-11-10 DIAGNOSIS — G4733 Obstructive sleep apnea (adult) (pediatric): Secondary | ICD-10-CM

## 2023-11-10 NOTE — Telephone Encounter (Signed)
 Pt showed up at 2:22pm for 1:00pm appt. Relayed to front she is not using CPAP and would like to discuss treatment options. She r/s appt for 02/09/24.

## 2023-11-21 DIAGNOSIS — G4733 Obstructive sleep apnea (adult) (pediatric): Secondary | ICD-10-CM | POA: Diagnosis not present

## 2023-11-30 DIAGNOSIS — I10 Essential (primary) hypertension: Secondary | ICD-10-CM | POA: Diagnosis not present

## 2023-11-30 DIAGNOSIS — E785 Hyperlipidemia, unspecified: Secondary | ICD-10-CM | POA: Diagnosis not present

## 2023-12-05 DIAGNOSIS — M25511 Pain in right shoulder: Secondary | ICD-10-CM | POA: Diagnosis not present

## 2023-12-08 DIAGNOSIS — M50322 Other cervical disc degeneration at C5-C6 level: Secondary | ICD-10-CM | POA: Diagnosis not present

## 2023-12-08 DIAGNOSIS — M9901 Segmental and somatic dysfunction of cervical region: Secondary | ICD-10-CM | POA: Diagnosis not present

## 2024-01-11 DIAGNOSIS — M9901 Segmental and somatic dysfunction of cervical region: Secondary | ICD-10-CM | POA: Diagnosis not present

## 2024-01-11 DIAGNOSIS — M50322 Other cervical disc degeneration at C5-C6 level: Secondary | ICD-10-CM | POA: Diagnosis not present

## 2024-02-08 ENCOUNTER — Telehealth: Payer: Self-pay | Admitting: Neurology

## 2024-02-08 NOTE — Telephone Encounter (Signed)
 request to cancel appointment

## 2024-02-09 ENCOUNTER — Ambulatory Visit: Payer: Medicare Other | Admitting: Family Medicine

## 2024-02-24 ENCOUNTER — Other Ambulatory Visit: Payer: Self-pay | Admitting: Internal Medicine

## 2024-02-24 DIAGNOSIS — Z1231 Encounter for screening mammogram for malignant neoplasm of breast: Secondary | ICD-10-CM

## 2024-03-05 DIAGNOSIS — K080 Exfoliation of teeth due to systemic causes: Secondary | ICD-10-CM | POA: Diagnosis not present

## 2024-03-13 DIAGNOSIS — K080 Exfoliation of teeth due to systemic causes: Secondary | ICD-10-CM | POA: Diagnosis not present

## 2024-03-14 ENCOUNTER — Ambulatory Visit

## 2024-04-02 ENCOUNTER — Ambulatory Visit
Admission: RE | Admit: 2024-04-02 | Discharge: 2024-04-02 | Disposition: A | Discharge status: Home / Self Care | Source: Ambulatory Visit | Attending: Internal Medicine | Admitting: Internal Medicine

## 2024-04-02 DIAGNOSIS — Z1231 Encounter for screening mammogram for malignant neoplasm of breast: Secondary | ICD-10-CM

## 2024-04-06 DIAGNOSIS — H5203 Hypermetropia, bilateral: Secondary | ICD-10-CM | POA: Diagnosis not present

## 2024-04-11 DIAGNOSIS — M5136 Other intervertebral disc degeneration, lumbar region with discogenic back pain only: Secondary | ICD-10-CM | POA: Diagnosis not present

## 2024-04-11 DIAGNOSIS — M9903 Segmental and somatic dysfunction of lumbar region: Secondary | ICD-10-CM | POA: Diagnosis not present

## 2024-04-11 DIAGNOSIS — M9904 Segmental and somatic dysfunction of sacral region: Secondary | ICD-10-CM | POA: Diagnosis not present

## 2024-04-11 DIAGNOSIS — M9905 Segmental and somatic dysfunction of pelvic region: Secondary | ICD-10-CM | POA: Diagnosis not present

## 2024-04-17 DIAGNOSIS — K080 Exfoliation of teeth due to systemic causes: Secondary | ICD-10-CM | POA: Diagnosis not present

## 2024-04-24 DIAGNOSIS — G4733 Obstructive sleep apnea (adult) (pediatric): Secondary | ICD-10-CM | POA: Diagnosis not present

## 2024-04-24 DIAGNOSIS — F5104 Psychophysiologic insomnia: Secondary | ICD-10-CM | POA: Diagnosis not present

## 2024-04-24 DIAGNOSIS — G4752 REM sleep behavior disorder: Secondary | ICD-10-CM | POA: Diagnosis not present

## 2024-06-13 DIAGNOSIS — M5136 Other intervertebral disc degeneration, lumbar region with discogenic back pain only: Secondary | ICD-10-CM | POA: Diagnosis not present

## 2024-06-13 DIAGNOSIS — M9905 Segmental and somatic dysfunction of pelvic region: Secondary | ICD-10-CM | POA: Diagnosis not present

## 2024-06-13 DIAGNOSIS — M9903 Segmental and somatic dysfunction of lumbar region: Secondary | ICD-10-CM | POA: Diagnosis not present

## 2024-06-13 DIAGNOSIS — M9904 Segmental and somatic dysfunction of sacral region: Secondary | ICD-10-CM | POA: Diagnosis not present

## 2024-06-14 DIAGNOSIS — R197 Diarrhea, unspecified: Secondary | ICD-10-CM | POA: Diagnosis not present

## 2024-07-05 DIAGNOSIS — Z0189 Encounter for other specified special examinations: Secondary | ICD-10-CM | POA: Diagnosis not present

## 2024-07-06 DIAGNOSIS — E785 Hyperlipidemia, unspecified: Secondary | ICD-10-CM | POA: Diagnosis not present

## 2024-07-10 DIAGNOSIS — Z0000 Encounter for general adult medical examination without abnormal findings: Secondary | ICD-10-CM | POA: Diagnosis not present

## 2024-07-10 DIAGNOSIS — I10 Essential (primary) hypertension: Secondary | ICD-10-CM | POA: Diagnosis not present

## 2024-07-10 DIAGNOSIS — Z1339 Encounter for screening examination for other mental health and behavioral disorders: Secondary | ICD-10-CM | POA: Diagnosis not present

## 2024-07-10 DIAGNOSIS — Z1331 Encounter for screening for depression: Secondary | ICD-10-CM | POA: Diagnosis not present

## 2024-07-12 DIAGNOSIS — K080 Exfoliation of teeth due to systemic causes: Secondary | ICD-10-CM | POA: Diagnosis not present

## 2024-07-16 DIAGNOSIS — K219 Gastro-esophageal reflux disease without esophagitis: Secondary | ICD-10-CM | POA: Diagnosis not present

## 2024-07-16 DIAGNOSIS — R197 Diarrhea, unspecified: Secondary | ICD-10-CM | POA: Diagnosis not present

## 2024-07-16 DIAGNOSIS — Z860101 Personal history of adenomatous and serrated colon polyps: Secondary | ICD-10-CM | POA: Diagnosis not present

## 2024-08-16 DIAGNOSIS — M5136 Other intervertebral disc degeneration, lumbar region with discogenic back pain only: Secondary | ICD-10-CM | POA: Diagnosis not present

## 2024-08-16 DIAGNOSIS — M9904 Segmental and somatic dysfunction of sacral region: Secondary | ICD-10-CM | POA: Diagnosis not present

## 2024-08-16 DIAGNOSIS — M9905 Segmental and somatic dysfunction of pelvic region: Secondary | ICD-10-CM | POA: Diagnosis not present

## 2024-08-16 DIAGNOSIS — M9903 Segmental and somatic dysfunction of lumbar region: Secondary | ICD-10-CM | POA: Diagnosis not present

## 2024-08-27 DIAGNOSIS — K529 Noninfective gastroenteritis and colitis, unspecified: Secondary | ICD-10-CM | POA: Diagnosis not present

## 2024-08-27 DIAGNOSIS — D123 Benign neoplasm of transverse colon: Secondary | ICD-10-CM | POA: Diagnosis not present

## 2024-08-27 DIAGNOSIS — Z09 Encounter for follow-up examination after completed treatment for conditions other than malignant neoplasm: Secondary | ICD-10-CM | POA: Diagnosis not present

## 2024-08-29 DIAGNOSIS — D123 Benign neoplasm of transverse colon: Secondary | ICD-10-CM | POA: Diagnosis not present

## 2024-08-30 DIAGNOSIS — M9905 Segmental and somatic dysfunction of pelvic region: Secondary | ICD-10-CM | POA: Diagnosis not present

## 2024-08-30 DIAGNOSIS — M9904 Segmental and somatic dysfunction of sacral region: Secondary | ICD-10-CM | POA: Diagnosis not present

## 2024-08-30 DIAGNOSIS — M9903 Segmental and somatic dysfunction of lumbar region: Secondary | ICD-10-CM | POA: Diagnosis not present
